# Patient Record
Sex: Female | Born: 1985 | Race: Black or African American | Hispanic: No | Marital: Married | Smoking: Never smoker
Health system: Southern US, Community
[De-identification: ages and names within clinical notes are randomized; demographics above are authoritative.]

## PROBLEM LIST (undated history)

## (undated) DIAGNOSIS — G4730 Sleep apnea, unspecified: Secondary | ICD-10-CM

---

## 2021-02-27 DIAGNOSIS — D649 Anemia, unspecified: Secondary | ICD-10-CM

## 2021-02-27 HISTORY — DX: Anemia, unspecified: D64.9

## 2021-03-12 ENCOUNTER — Other Ambulatory Visit (HOSPITAL_COMMUNITY): Payer: Self-pay | Admitting: General Surgery

## 2021-03-12 ENCOUNTER — Other Ambulatory Visit: Payer: Self-pay | Admitting: General Surgery

## 2021-03-12 DIAGNOSIS — M5441 Lumbago with sciatica, right side: Secondary | ICD-10-CM

## 2021-03-12 DIAGNOSIS — G8929 Other chronic pain: Secondary | ICD-10-CM

## 2021-03-12 DIAGNOSIS — M5442 Lumbago with sciatica, left side: Secondary | ICD-10-CM

## 2021-03-25 ENCOUNTER — Other Ambulatory Visit: Payer: Self-pay

## 2021-03-25 ENCOUNTER — Ambulatory Visit (HOSPITAL_COMMUNITY)
Admission: RE | Admit: 2021-03-25 | Discharge: 2021-03-25 | Disposition: A | Discharge status: Home / Self Care | Payer: 59 | Source: Ambulatory Visit | Attending: General Surgery | Admitting: General Surgery

## 2021-03-25 DIAGNOSIS — G8929 Other chronic pain: Secondary | ICD-10-CM | POA: Diagnosis present

## 2021-03-25 DIAGNOSIS — M5442 Lumbago with sciatica, left side: Secondary | ICD-10-CM | POA: Diagnosis present

## 2021-03-25 DIAGNOSIS — M5441 Lumbago with sciatica, right side: Secondary | ICD-10-CM | POA: Diagnosis present

## 2021-03-27 ENCOUNTER — Ambulatory Visit (HOSPITAL_COMMUNITY)
Admission: RE | Admit: 2021-03-27 | Discharge: 2021-03-27 | Disposition: A | Discharge status: Home / Self Care | Payer: 59 | Source: Ambulatory Visit | Attending: General Surgery | Admitting: General Surgery

## 2021-03-27 ENCOUNTER — Other Ambulatory Visit: Payer: Self-pay

## 2021-03-27 DIAGNOSIS — M5441 Lumbago with sciatica, right side: Secondary | ICD-10-CM | POA: Insufficient documentation

## 2021-03-27 DIAGNOSIS — G8929 Other chronic pain: Secondary | ICD-10-CM | POA: Diagnosis present

## 2021-03-27 DIAGNOSIS — M5442 Lumbago with sciatica, left side: Secondary | ICD-10-CM | POA: Insufficient documentation

## 2021-04-10 ENCOUNTER — Encounter: Payer: 59 | Attending: General Surgery | Admitting: Skilled Nursing Facility1

## 2021-04-10 ENCOUNTER — Other Ambulatory Visit: Payer: Self-pay

## 2021-04-10 ENCOUNTER — Encounter: Payer: Self-pay | Admitting: Skilled Nursing Facility1

## 2021-04-10 DIAGNOSIS — E669 Obesity, unspecified: Secondary | ICD-10-CM | POA: Insufficient documentation
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
## 2021-04-18 ENCOUNTER — Other Ambulatory Visit: Payer: Self-pay

## 2021-04-18 ENCOUNTER — Encounter: Payer: 59 | Admitting: Skilled Nursing Facility1

## 2021-04-18 DIAGNOSIS — E669 Obesity, unspecified: Secondary | ICD-10-CM | POA: Diagnosis not present

## 2021-04-18 NOTE — Progress Notes (Signed)
Supervised Weight Loss Visit ?Bariatric Nutrition Education ? ?Sleeve Gastrectomy  ? ?SWL visits 1 of 3 ? ?NUTRITION ASSESSMENT ? ?Anthropometrics  ?Start weight at [facility]: 266.3 lbs (date: 04/10/2021)  ?Weight: 267.2 pounds ?BMI: 44.46 kg/m2   ?  ?Clinical  ?Medical hx: N/A ?Medications: multivitamin, iron  ?Labs: RBC 3.13, HGB 9.4, HCT 28.9, A1C 5.7 ?Notable signs/symptoms: none reported ?Any previous deficiencies? YES, anemia ? ?Lifestyle & Dietary Hx ? ?Pt states she has a carpal tunnel surgery tomorrow.  ?Pt states her friend from out of town is going to help her and husband out with the kids. ?Pt states her energy level has been so so and has been taking her iron pills daily. Pt states she is going to start eating beets.  ?Pt states she just got a sleep study done. ?Pt states she has been suffering form seasonal allergies lately.  Pt states seh has not been eating 3 meals a day because her appetite has been reduced since being so tired. Pt states her husband and friend will meal prep for her. Pt states she has bought some plant based proteins to try and wants to have some more variety.  ?Pt states her husband is very supportive and has been thinking ideas of how to support her even more.  ?Pt states she has slowed down with her meals and counts in her head.  ? ?Estimated daily fluid intake:  oz ?Supplements:  ?Current average weekly physical activity: walking her dog, some bike riding  ? ?24-Hr Dietary Recall ?First Meal: cream of wheat + egg or skipped or honey citrus tea from [employer]bucks ?Snack:  ?Second Meal: tortilla chicken soup or smoothie with greens, fruits, water, ice, protein powder  ?Snack:  ?Third Meal: tortilla chicken soup ?Snack:  ?Beverages: honey citrus tea, water ? ?Estimated Energy Needs ?Calories: 1500 ? ? ?NUTRITION DIAGNOSIS  ?Overweight/obesity ([place]-3.3) related to past poor dietary habits and physical inactivity as evidenced by patient w/ planned sleeve gastrectomy surgery following dietary  guidelines for continued weight loss. ? ? ?NUTRITION INTERVENTION  ?Nutrition counseling (C-1) and education (E-2) to facilitate bariatric surgery goals. ? ?Pre-Op Goals Progress & New Goals ?continue: Consume 3 meals per day or try to eat every 3-5 hours: eat breakfast ?continue: Practice CHEWING your food (aim for applesauce consistency): slow down when eating ?NEW: make a smoothie for breakfast ?NEW: increase activity to 6 days a week ?Continue: reducing diet mentality and feeling proud of the food choices you make ? ?Handouts Provided Include  ? ? ?Learning Style & Readiness for Change ?Teaching method utilized: Visual & Auditory  ?Demonstrated degree of understanding via: Teach Back  ?Readiness Level: action ?Barriers to learning/adherence to lifestyle change: emotional eating ? ?RD's Notes for next Visit  ?Assess pts adherence to chosen goals ? ? ?MONITORING & EVALUATION ?Dietary intake, weekly physical activity, body weight, and pre-op goals in 1 month.  ? ?Next Steps  ?Patient is to return to [facility] in 1 month ?

## 2021-05-15 ENCOUNTER — Encounter: Payer: 59 | Attending: General Surgery | Admitting: Skilled Nursing Facility1

## 2021-05-15 DIAGNOSIS — Z6841 Body Mass Index (BMI) 40.0 and over, adult: Secondary | ICD-10-CM | POA: Insufficient documentation

## 2021-05-15 DIAGNOSIS — E669 Obesity, unspecified: Secondary | ICD-10-CM | POA: Insufficient documentation

## 2021-05-15 DIAGNOSIS — Z713 Dietary counseling and surveillance: Secondary | ICD-10-CM | POA: Insufficient documentation

## 2021-05-15 DIAGNOSIS — M5442 Lumbago with sciatica, left side: Secondary | ICD-10-CM | POA: Diagnosis not present

## 2021-05-15 DIAGNOSIS — G8929 Other chronic pain: Secondary | ICD-10-CM | POA: Insufficient documentation

## 2021-05-15 DIAGNOSIS — M5441 Lumbago with sciatica, right side: Secondary | ICD-10-CM | POA: Insufficient documentation

## 2021-05-15 DIAGNOSIS — Z9189 Other specified personal risk factors, not elsewhere classified: Secondary | ICD-10-CM | POA: Diagnosis not present

## 2021-05-15 NOTE — Progress Notes (Addendum)
Supervised Weight Loss Visit ?Bariatric Nutrition Education ? ?Sleeve Gastrectomy  ? ?SWL visits 2 of 3 ? ?NUTRITION ASSESSMENT ? ?Anthropometrics  ?Start weight at [facility]: 266.3 lbs (date: 04/10/2021)  ?Weight:  269 pounds ?BMI: 44.76 kg/m2   ?  ?Clinical  ?Medical hx: N/A ?Medications: multivitamin, iron 320mg , progesterone  ?Labs: RBC 3.13, HGB 9.4, HCT 28.9, A1C 5.7 ?Notable signs/symptoms: none reported ?Any previous deficiencies? YES, anemia ? ?Lifestyle & Dietary Hx ? ?Pt states due to her consistently heavy menstrual cycles leading to iron deficiency anemia she did get a dilation and curettage which has for now reduced the issue.  ?Pt states she is doing better now and her energy level is much better as well. ?Pt states she works from home ?Pt states she was advised by a family member to get a healthy breakfast ideas book and thinks maybe she will get that.  ?Pt states she has had no fast food and is very proud of this.  ?Pt states due to her low energy level and was busy getting her kids to school she was not able to work on some of the goals this past month. Pt states she is not used to eating breakfast so is struggling with the idea she needs to eat it.  ? ?Pt states making a Smoothie went well for 3 days but then get out of doing it due to taking her kids to school and being tired.  ? ?Pt states she has been tired and trying to go to bed early. ? ?Estimated daily fluid intake:  oz ?Supplements:  ?Current average weekly physical activity: walking her dog, some bike riding  ? ?24-Hr Dietary Recall ?First Meal: cream of wheat + egg or skipped or honey citrus tea from [employer] ?coffee ?Snack:  ?Second Meal: tortilla chicken soup or smoothie with greens, fruits, water, ice, protein powder  ?Snack:  ?Third Meal: tortilla chicken soup or spaghetti (family), chicken, peas ?Snack:  ?Beverages: honey citrus tea, current: water, coffee ? ?Estimated Energy Needs ?Calories: 1500 ? ? ?NUTRITION DIAGNOSIS   ?Overweight/obesity ([place]-3.3) related to past poor dietary habits and physical inactivity as evidenced by patient w/ planned sleeve gastrectomy surgery following dietary guidelines for continued weight loss. ? ? ?NUTRITION INTERVENTION  ?Nutrition counseling (C-1) and education (E-2) to facilitate bariatric surgery goals. ? ? ? ?Pre-Op Goals Progress & New Goals ?continue: Practice CHEWING your food (aim for applesauce consistency): slow down when eating ?Continue: reducing diet mentality and feeling proud of the food choices you make ?Re-engage: Consume 3 meals per day or try to eat every 3-5 hours ?Re-engage: eat something for breakfast or a protein shake ?Re-engage: increase activity to 6 days a week ? ?Handouts Provided Include  ? ? ?Learning Style & Readiness for Change ?Teaching method utilized: Visual & Auditory  ?Demonstrated degree of understanding via: Teach Back  ?Readiness Level: action ?Barriers to learning/adherence to lifestyle change: emotional eating ? ?RD's Notes for next Visit  ?Assess pts adherence to chosen goals ? ? ?MONITORING & EVALUATION ?Dietary intake, weekly physical activity, body weight, and pre-op goals in 1 month.  ? ?Next Steps  ?Patient is to return to [facility] in 1 month ?

## 2021-06-18 ENCOUNTER — Encounter: Payer: Self-pay | Admitting: Skilled Nursing Facility1

## 2021-06-18 ENCOUNTER — Encounter: Payer: 59 | Attending: General Surgery | Admitting: Skilled Nursing Facility1

## 2021-06-18 DIAGNOSIS — E669 Obesity, unspecified: Secondary | ICD-10-CM | POA: Insufficient documentation

## 2021-06-18 NOTE — Progress Notes (Unsigned)
Supervised Weight Loss Visit Bariatric Nutrition Education  Sleeve Gastrectomy   SWL visits 3 of 3  Pt completed visits.   Pt has cleared nutrition requirements.   Pt has completed visits. No further supervised visits required/recomended    NUTRITION ASSESSMENT  Anthropometrics  Start weight at [facility]: 266.3 lbs (date: 04/10/2021)  Weight:  272.8 pounds BMI: 45.4 kg/m2     Clinical  Medical hx: N/A Medications: multivitamin, iron 320mg , progesterone  Labs: RBC 3.13, HGB 9.4, HCT 28.9, A1C 5.7 Notable signs/symptoms: none reported Any previous deficiencies? YES, anemia  Lifestyle & Dietary Hx  Pt states she had left hand carpel tunnel surgery recently. Pt states she needs to set an alarm to remind her to eat breakfast. Pt states the weekend is easier to get in all the meals Pt states she does not want to get sick after surgery. Pt states she is trying to get nutrition straight and the mental health for therapy. Pt states her dinner preps have gone well. Pt states she includes her son in meal prep. Mindful of what she is cooking and preparing for he and the family. And keeping foods natural Talking to her therapist: When the wt starts to fall off, she does not want to get too fixated on the wt, stating she has a love hate relationship with food. Pt is recognizes the need for mindful decisions when making food choices. Pt states she is contemplating going vegetarian or pescatarian.     Estimated daily fluid intake:  oz Supplements:  Current average weekly physical activity: walking her dog, some bike riding   24-Hr Dietary Recall First Meal: skip or protein shake or  Snack:  Second Meal: tortilla chicken soup or smoothie with greens, fruits, water, ice, protein powder  Snack:  Third Meal: tortilla chicken soup or spaghetti (family), chicken, peas Snack:  Beverages: honey citrus tea, current: water, coffee   Estimated Energy Needs Calories: 1500   NUTRITION  DIAGNOSIS  Overweight/obesity ([place]-3.3) related to past poor dietary habits and physical inactivity as evidenced by patient w/ planned sleeve gastrectomy surgery following dietary guidelines for continued weight loss.   NUTRITION INTERVENTION  Nutrition counseling (C-1) and education (E-2) to facilitate bariatric surgery goals.    Pre-Op Goals Progress & New Goals continue: Practice CHEWING your food (aim for applesauce consistency): slow down when eating Continue: reducing diet mentality and feeling proud of the food choices you make Continue: Consume 3 meals per day or try to eat every 3-5 hours Continue: eat something for breakfast or a protein shake; set an alarm Continue: increase activity to 6 days a week  Handouts Provided Include    Learning Style & Readiness for Change Teaching method utilized: Visual & Auditory  Demonstrated degree of understanding via: Teach Back  Readiness Level: action Barriers to learning/adherence to lifestyle change: emotional eating  RD's Notes for next Visit  Assess pts adherence to chosen goals   MONITORING & EVALUATION Dietary intake, weekly physical activity, body weight, and pre-op goals in 1 month.   Next Steps  Pt has completed visits. No further supervised visits required/recomended  Patient is to return to [facility] for pre-op class; date TBD

## 2021-06-19 ENCOUNTER — Encounter: Payer: Self-pay | Admitting: Skilled Nursing Facility1

## 2021-07-01 ENCOUNTER — Encounter: Payer: 59 | Attending: General Surgery | Admitting: Skilled Nursing Facility1

## 2021-07-01 ENCOUNTER — Encounter: Payer: Self-pay | Admitting: Skilled Nursing Facility1

## 2021-07-01 DIAGNOSIS — E669 Obesity, unspecified: Secondary | ICD-10-CM | POA: Diagnosis present

## 2021-07-01 NOTE — Progress Notes (Signed)
Pre-Operative Nutrition Class:    Patient was seen on 07/01/2021 for Pre-Operative [facility]ric [facility]    Surgery date: 07/29/2021 Surgery type: sleeve Start weight at [facility]: 266.3 Weight today: 271.7 pounds  Samples given per MNT protocol. Patient educated on appropriate usage: Bariatric Advantage Multivitamin Lot # [id number] Exp:08/24   Bariatric Advantage Calcium  Lot #[id number] Exp: 12/02/2021   Protein Shake Lot # [id number] Exp: [id number]   The following the learning objectives were met by the patient during this course: Identify Pre-Op Dietary Goals and will begin 2 weeks pre-operatively Identify appropriate sources of fluids and proteins  State protein recommendations and appropriate sources pre and post-operatively Identify Post-Operative Dietary Goals and will follow for 2 weeks post-operatively Identify appropriate multivitamin and calcium sources Describe the need for physical activity post-operatively and will follow MD recommendations State when to call healthcare provider regarding medication questions or post-operative complications When having a diagnosis of diabetes understanding hypoglycemia symptoms and the inclusion of 1 complex carbohydrate per meal  Handouts given during class include: Pre-Op Bariatric Surgery Diet Handout Protein Shake Handout Post-Op Bariatric Surgery Nutrition Handout [facility] Program Information Flyer Support Group Information Flyer [facility] Bariatric Supplements Price List  Follow-Up Plan: Patient will follow-up at [facility] 2 weeks post operatively for diet advancement per MD.

## 2021-07-11 ENCOUNTER — Ambulatory Visit: Payer: Self-pay | Admitting: General Surgery

## 2021-07-11 DIAGNOSIS — D649 Anemia, unspecified: Secondary | ICD-10-CM

## 2021-07-12 ENCOUNTER — Other Ambulatory Visit (HOSPITAL_COMMUNITY): Payer: Self-pay

## 2021-07-12 NOTE — Patient Instructions (Addendum)
DUE TO COVID-19 ONLY TWO VISITORS  (aged 36 and older)  ARE ALLOWED TO COME WITH YOU AND STAY IN THE WAITING ROOM ONLY DURING PRE OP AND PROCEDURE.   **NO VISITORS ARE ALLOWED IN THE SHORT STAY AREA OR RECOVERY ROOM!!**  IF YOU WILL BE ADMITTED INTO THE HOSPITAL YOU ARE ALLOWED ONLY FOUR SUPPORT PEOPLE DURING VISITATION HOURS ONLY (7 AM -8PM)   The support person(s) must pass our screening, gel in and out, and wear a mask at all times, including in the patient's room. Patients must also wear a mask when staff or their support person are in the room. Visitors GUEST BADGE MUST BE WORN VISIBLY  One adult visitor may remain with you overnight and MUST be in the room by 8 P.M.     Your procedure is scheduled on: 07/29/21   Report to [facility] Main Entrance    Report to admitting at  11:00 AM   Call this number if you have problems the morning of surgery [phone]   Do not eat food :After  6:00 PM the night before surgery   start a clear liquid diet until 10:00 AM the day of surgery   Water Black Coffee (sugar ok, NO MILK/CREAM OR CREAMERS)  Tea (sugar ok, NO MILK/CREAM OR CREAMERS) regular and decaf                             Plain Jell-O (NO RED)                                           Fruit ices (not with fruit pulp, NO RED)                                     Popsicles (NO RED)                                                                  Juice: apple, WHITE grape, WHITE cranberry Sports drinks like Gatorade (NO RED) Clear broth(vegetable,chicken,beef)                    The day of surgery:  Drink ONE (1) Pre-Surgery Clear Ensure or G2  the morning of surgery. Drink in one sitting. Do not sip.  This drink was given to you during your hospital  pre-op appointment visit. Nothing else to drink after completing the   G2. At 10:00 am          If you have questions, please contact your surgeon's office.     THE G2 DRINK YOU DRINK BEFORE YOU LEAVE HOME WILL  BE THE LAST FLUIDS YOU DRINK BEFORE SURGERY THEN NOTHING MORE BY MOUTH.   PAIN IS EXPECTED AFTER SURGERY AND WILL NOT BE COMPLETELY ELIMINATED.   AMBULATION AND TYLENOL WILL HELP REDUCE INCISIONAL AND GAS PAIN. MOVEMENT IS KEY!  YOU ARE EXPECTED TO BE OUT OF BED WITHIN 4 HOURS OF ADMISSION TO YOUR PATIENT ROOM.  SITTING IN THE RECLINER THROUGHOUT THE DAY IS IMPORTANT FOR  DRINKING FLUIDS AND MOVING GAS THROUGHOUT THE GI TRACT.  COMPRESSION STOCKINGS SHOULD BE WORN [facility] STAY UNLESS YOU ARE WALKING.   INCENTIVE SPIROMETER SHOULD BE USED EVERY HOUR WHILE AWAKE TO DECREASE POST-OPERATIVE COMPLICATIONS SUCH AS PNEUMONIA.  WHEN DISCHARGED HOME, IT IS IMPORTANT TO CONTINUE TO WALK EVERY HOUR AND USE THE INCENTIVE SPIROMETER EVERY HOUR.    Bring mask and tubing       FOLLOW BOWEL PREP AND ANY ADDITIONAL PRE OP INSTRUCTIONS YOU RECEIVED FROM YOUR SURGEON'S OFFICE!!!     Oral Hygiene is also important to reduce your risk of infection.                                    Remember - BRUSH YOUR TEETH THE MORNING OF SURGERY WITH YOUR REGULAR TOOTHPASTE   Do NOT smoke after Midnight   Take these medicines the morning of surgery with A SIP OF WATER: none                                You may not have any metal on your body including hair pins, jewelry, and body piercing             Do not wear make-up, lotions, powders, perfumes/cologne, or deodorant  Do not wear nail polish including gel and S&S, artificial/acrylic nails, or any other type of covering on natural nails including finger and toenails. If you have artificial nails, gel coating, etc. that needs to be removed by a nail salon please have this removed prior to surgery or surgery may need to be canceled/ delayed if the surgeon/ anesthesia feels like they are unable to be safely monitored.   Do not shave  48 hours prior to surgery.     Do not bring valuables to the hospital. [facility] IS NOT              RESPONSIBLE   FOR VALUABLES.   Contacts, dentures or bridgework may not be worn into surgery.   Bring small overnight bag day of surgery.   DO NOT BRING YOUR HOME MEDICATIONS TO THE HOSPITAL. PHARMACY WILL DISPENSE MEDICATIONS LISTED ON YOUR MEDICATION LIST TO YOU DURING YOUR ADMISSION IN THE HOSPITAL!     Special Instructions: Bring a copy of your healthcare power of attorney and living will documents  the day of surgery if you haven't scanned them before.              Please read over the following fact sheets you were given: IF YOU HAVE QUESTIONS ABOUT YOUR PRE-OP INSTRUCTIONS PLEASE CALL [phone]     [facility] - Preparing for Surgery Before surgery, you can play an important role.  Because skin is not sterile, your skin needs to be as free of germs as possible.  You can reduce the number of germs on your skin by washing with CHG (chlorahexidine gluconate) soap before surgery.  CHG is an antiseptic cleaner which kills germs and bonds with the skin to continue killing germs even after washing. Please DO NOT use if you have an allergy to CHG or antibacterial soaps.  If your skin becomes reddened/irritated stop using the CHG and inform your nurse when you arrive at Short Stay. Do not shave (including legs and underarms) for at least 48 hours prior to the first CHG shower.   Please  follow these instructions carefully:  1.  Shower with CHG Soap the night before surgery and the  morning of Surgery.  2.  If you choose to wash your hair, wash your hair first as usual with your  normal  shampoo.  3.  After you shampoo, rinse your hair and body thoroughly to remove the  shampoo.                            4.  Use CHG as you would any other liquid soap.  You can apply chg directly  to the skin and wash                       Gently with a scrungie or clean washcloth.  5.  Apply the CHG Soap to your body ONLY FROM THE NECK DOWN.   Do not use on face/ open                           Wound or open  sores. Avoid contact with eyes, ears mouth and genitals (private parts).                       Wash face,  Genitals (private parts) with your normal soap.             6.  Wash thoroughly, paying special attention to the area where your surgery  will be performed.  7.  Thoroughly rinse your body with warm water from the neck down.  8.  DO NOT shower/wash with your normal soap after using and rinsing off  the CHG Soap.                9.  Pat yourself dry with a clean towel.            10.  Wear clean pajamas.            11.  Place clean sheets on your bed the night of your first shower and do not  sleep with pets. Day of Surgery : Do not apply any lotions/deodorants the morning of surgery.  Please wear clean clothes to the hospital/surgery center.  FAILURE TO FOLLOW THESE INSTRUCTIONS MAY RESULT IN THE CANCELLATION OF YOUR SURGERY    ________________________________________________________________________   Incentive Spirometer  An incentive spirometer is a tool that can help keep your lungs clear and active. This tool measures how well you are filling your lungs with each breath. Taking long deep breaths may help reverse or decrease the chance of developing breathing (pulmonary) problems (especially infection) following: A long period of time when you are unable to move or be active. BEFORE THE PROCEDURE  If the spirometer includes an indicator to show your best effort, your nurse or respiratory therapist will set it to a desired goal. If possible, sit up straight or lean slightly forward. Try not to slouch. Hold the incentive spirometer in an upright position. INSTRUCTIONS FOR USE  Sit on the edge of your bed if possible, or sit up as far as you can in bed or on a chair. Hold the incentive spirometer in an upright position. Breathe out normally. Place the mouthpiece in your mouth and seal your lips tightly around it. Breathe in slowly and as deeply as possible, raising the piston or the  ball toward the top of the column. Hold your breath for 3-5  seconds or for as long as possible. Allow the piston or ball to fall to the bottom of the column. Remove the mouthpiece from your mouth and breathe out normally. Rest for a few seconds and repeat Steps 1 through 7 at least 10 times every 1-2 hours when you are awake. Take your time and take a few normal breaths between deep breaths. The spirometer may include an indicator to show your best effort. Use the indicator as a goal to work toward during each repetition. After each set of 10 deep breaths, practice coughing to be sure your lungs are clear. If you have an incision (the cut made at the time of surgery), support your incision when coughing by placing a pillow or rolled up towels firmly against it. Once you are able to get out of bed, walk around indoors and cough well. You may stop using the incentive spirometer when instructed by your caregiver.  RISKS AND COMPLICATIONS Take your time so you do not get dizzy or light-headed. If you are in pain, you may need to take or ask for pain medication before doing incentive spirometry. It is harder to take a deep breath if you are having pain. AFTER USE Rest and breathe slowly and easily. It can be helpful to keep track of a log of your progress. Your caregiver can provide you with a simple table to help with this. If you are using the spirometer at home, follow these instructions: SEEK MEDICAL CARE IF:  You are having difficultly using the spirometer. You have trouble using the spirometer as often as instructed. Your pain medication is not giving enough relief while using the spirometer. You develop fever of 100.5 F (38.1 C) or higher. SEEK IMMEDIATE MEDICAL CARE IF:  You cough up bloody sputum that had not been present before. You develop fever of 102 F (38.9 C) or greater. You develop worsening pain at or near the incision site. MAKE SURE YOU:  Understand these instructions. Will  watch your condition. Will get help right away if you are not doing well or get worse. Document Released: 05/26/2006 Document Revised: 04/07/2011 Document Reviewed: 07/27/2006 [facility] Patient Information 2014 [place], [place].   ________________________________________________________________________

## 2021-07-18 ENCOUNTER — Encounter (HOSPITAL_COMMUNITY): Payer: Self-pay | Admitting: *Deleted

## 2021-07-18 ENCOUNTER — Other Ambulatory Visit: Payer: Self-pay

## 2021-07-18 ENCOUNTER — Encounter (HOSPITAL_COMMUNITY)
Admission: RE | Admit: 2021-07-18 | Discharge: 2021-07-18 | Disposition: A | Discharge status: Home / Self Care | Payer: 59 | Source: Ambulatory Visit | Attending: General Surgery | Admitting: General Surgery

## 2021-07-18 DIAGNOSIS — Z01812 Encounter for preprocedural laboratory examination: Secondary | ICD-10-CM | POA: Insufficient documentation

## 2021-07-18 DIAGNOSIS — D649 Anemia, unspecified: Secondary | ICD-10-CM | POA: Diagnosis not present

## 2021-07-18 HISTORY — DX: Sleep apnea, unspecified: G47.30

## 2021-07-18 LAB — COMPREHENSIVE METABOLIC PANEL
ALT: 44 U/L (ref 0–44)
AST: 31 U/L (ref 15–41)
Albumin: 4.4 g/dL (ref 3.5–5.0)
Alkaline Phosphatase: 53 U/L (ref 38–126)
Anion gap: 8 (ref 5–15)
BUN: 9 mg/dL (ref 6–20)
CO2: 26 mmol/L (ref 22–32)
Calcium: 9.6 mg/dL (ref 8.9–10.3)
Chloride: 104 mmol/L (ref 98–111)
Creatinine, Ser: 0.7 mg/dL (ref 0.44–1.00)
GFR, Estimated: >60 mL/min (ref >60)
Glucose, Bld: 83 mg/dL (ref 70–99)
Potassium: 4.1 mmol/L (ref 3.5–5.1)
Sodium: 138 mmol/L (ref 135–145)
Total Bilirubin: 0.4 mg/dL (ref 0.3–1.2)
Total Protein: 8.3 g/dL — ABNORMAL HIGH (ref 6.5–8.1)

## 2021-07-18 LAB — CBC WITH DIFFERENTIAL/PLATELET
Abs Immature Granulocytes: 0 10*3/uL (ref 0.00–0.07)
Basophils Absolute: 0.1 10*3/uL (ref 0.0–0.1)
Basophils Relative: 1 %
Eosinophils Absolute: 0.2 10*3/uL (ref 0.0–0.5)
Eosinophils Relative: 4 %
HCT: 40.3 % (ref 36.0–46.0)
Hemoglobin: 13.2 g/dL (ref 12.0–15.0)
Immature Granulocytes: 0 %
Lymphocytes Relative: 33 %
Lymphs Abs: 1.6 10*3/uL (ref 0.7–4.0)
MCH: 32.1 pg (ref 26.0–34.0)
MCHC: 32.8 g/dL (ref 30.0–36.0)
MCV: 98.1 fL (ref 80.0–100.0)
Monocytes Absolute: 0.3 10*3/uL (ref 0.1–1.0)
Monocytes Relative: 7 %
Neutro Abs: 2.6 10*3/uL (ref 1.7–7.7)
Neutrophils Relative %: 55 %
Platelets: 326 10*3/uL (ref 150–400)
RBC: 4.11 MIL/uL (ref 3.87–5.11)
RDW: 13.8 % (ref 11.5–15.5)
WBC: 4.8 10*3/uL (ref 4.0–10.5)
nRBC: 0 % (ref 0.0–0.2)

## 2021-07-18 LAB — PROTIME-INR
INR: 1 (ref 0.8–1.2)
Prothrombin Time: 13.3 seconds (ref 11.4–15.2)

## 2021-07-18 LAB — TYPE AND SCREEN
ABO/RH(D): O POS
Antibody Screen: NEGATIVE

## 2021-07-18 LAB — APTT: aPTT: 31 seconds (ref 24–36)

## 2021-07-18 NOTE — Progress Notes (Signed)
Anesthesia note:  Bowel prep reminder:NA  PCP - Kathryne Sharper V.A. Cardiologist -no Other-   Chest x-ray - 03/26/21-epic EKG - 03/25/21-epic Stress Test - no ECHO - no Cardiac Cath - NA  Pacemaker/ICD device last checked:NA  Sleep Study - yes CPAP - yes  Pt is pre diabetic-NA Fasting Blood Sugar -  Checks Blood Sugar _____  Blood Thinner:NA Blood Thinner Instructions: Aspirin Instructions: Last Dose:  Anesthesia review: no  Patient denies shortness of breath, fever, cough and chest pain at PAT appointment Pt hag SOB when she was anemic but it is much better now  Patient verbalized understanding of instructions that were given to them at the PAT appointment. Patient was also instructed that they will need to review over the PAT instructions again at home before surgery. yes

## 2021-07-26 ENCOUNTER — Telehealth (HOSPITAL_COMMUNITY): Payer: Self-pay | Admitting: *Deleted

## 2021-07-26 NOTE — Anesthesia Preprocedure Evaluation (Signed)
Anesthesia Evaluation  Patient identified by MRN, date of birth, ID band Patient awake    Reviewed: Allergy & Precautions, NPO status , Patient's Chart, lab work & pertinent test results  Airway Mallampati: II  TM Distance: >3 FB Neck ROM: Full    Dental no notable dental hx. (+) Teeth Intact, Dental Advisory Given   Pulmonary sleep apnea and Continuous Positive Airway Pressure Ventilation ,    Pulmonary exam normal breath sounds clear to auscultation       Cardiovascular negative cardio ROS Normal cardiovascular exam Rhythm:Regular Rate:Normal     Neuro/Psych negative neurological ROS  negative psych ROS   GI/Hepatic negative GI ROS, (+)       alcohol use,   Endo/Other  Morbid obesityBMI 45  Renal/GU negative Renal ROS  negative genitourinary   Musculoskeletal negative musculoskeletal ROS (+)   Abdominal (+) + obese,   Peds  Hematology negative hematology ROS (+) Hb 13.2   Anesthesia Other Findings   Reproductive/Obstetrics negative OB ROS                            Anesthesia Physical Anesthesia Plan  ASA: 3  Anesthesia Plan: General   Post-op Pain Management: Tylenol PO (pre-op)*   Induction: Intravenous  PONV Risk Score and Plan: 4 or greater and Ondansetron, Dexamethasone, Midazolam, Scopolamine patch - Pre-op and Treatment may vary due to age or medical condition  Airway Management Planned: Oral ETT  Additional Equipment: None  Intra-op Plan:   Post-operative Plan: Extubation in OR  Informed Consent: I have reviewed the patients History and Physical, chart, labs and discussed the procedure including the risks, benefits and alternatives for the proposed anesthesia with the patient or authorized representative who has indicated his/her understanding and acceptance.     Dental advisory given  Plan Discussed with: CRNA  Anesthesia Plan Comments:         Anesthesia Quick Evaluation

## 2021-07-26 NOTE — Telephone Encounter (Signed)
Reviewed Gastric Sleeve discharge instructions with via phone.  Provided information on signs and symptoms to report, Phase 2 post op liquid diet, BELT program, Support Group and [facility] outpatient pharmacy. Patient can articulate understanding. All questions answered.  Patient provided with surgeon, dietician, and [facility] contact information for any comments, questions, or concerns via email and will receive discharge folder during hospitalization. Will f/u with patient within one week post operatively. 

## 2021-07-29 ENCOUNTER — Other Ambulatory Visit: Payer: Self-pay

## 2021-07-29 ENCOUNTER — Encounter (HOSPITAL_COMMUNITY)
Admission: RE | Disposition: A | Discharge status: Home / Self Care | Payer: Self-pay | Source: Home / Self Care | Attending: General Surgery

## 2021-07-29 ENCOUNTER — Inpatient Hospital Stay (HOSPITAL_COMMUNITY)
Admission: RE | Admit: 2021-07-29 | Discharge: 2021-07-31 | DRG: 621 | Disposition: A | Discharge status: Home / Self Care | Payer: 59 | Attending: General Surgery | Admitting: General Surgery

## 2021-07-29 ENCOUNTER — Other Ambulatory Visit (HOSPITAL_COMMUNITY): Payer: Self-pay

## 2021-07-29 ENCOUNTER — Encounter (HOSPITAL_COMMUNITY): Payer: Self-pay | Admitting: General Surgery

## 2021-07-29 ENCOUNTER — Inpatient Hospital Stay (HOSPITAL_COMMUNITY): Payer: 59 | Admitting: Anesthesiology

## 2021-07-29 DIAGNOSIS — Z9884 Bariatric surgery status: Principal | ICD-10-CM

## 2021-07-29 DIAGNOSIS — F419 Anxiety disorder, unspecified: Secondary | ICD-10-CM | POA: Diagnosis present

## 2021-07-29 DIAGNOSIS — Z6841 Body Mass Index (BMI) 40.0 and over, adult: Secondary | ICD-10-CM

## 2021-07-29 DIAGNOSIS — K760 Fatty (change of) liver, not elsewhere classified: Secondary | ICD-10-CM | POA: Diagnosis present

## 2021-07-29 DIAGNOSIS — Z83438 Family history of other disorder of lipoprotein metabolism and other lipidemia: Secondary | ICD-10-CM | POA: Diagnosis not present

## 2021-07-29 DIAGNOSIS — G8929 Other chronic pain: Secondary | ICD-10-CM | POA: Diagnosis present

## 2021-07-29 DIAGNOSIS — R7303 Prediabetes: Secondary | ICD-10-CM | POA: Diagnosis present

## 2021-07-29 DIAGNOSIS — G4733 Obstructive sleep apnea (adult) (pediatric): Secondary | ICD-10-CM | POA: Diagnosis present

## 2021-07-29 DIAGNOSIS — M5442 Lumbago with sciatica, left side: Secondary | ICD-10-CM | POA: Diagnosis present

## 2021-07-29 DIAGNOSIS — E781 Pure hyperglyceridemia: Secondary | ICD-10-CM | POA: Diagnosis present

## 2021-07-29 DIAGNOSIS — D649 Anemia, unspecified: Secondary | ICD-10-CM

## 2021-07-29 DIAGNOSIS — M5441 Lumbago with sciatica, right side: Secondary | ICD-10-CM | POA: Diagnosis present

## 2021-07-29 DIAGNOSIS — M5136 Other intervertebral disc degeneration, lumbar region: Secondary | ICD-10-CM | POA: Diagnosis present

## 2021-07-29 DIAGNOSIS — E7800 Pure hypercholesterolemia, unspecified: Secondary | ICD-10-CM | POA: Diagnosis present

## 2021-07-29 HISTORY — PX: UPPER GI ENDOSCOPY: SHX6162

## 2021-07-29 LAB — HEMOGLOBIN AND HEMATOCRIT, BLOOD
HCT: 37.1 % (ref 36.0–46.0)
Hemoglobin: 12.2 g/dL (ref 12.0–15.0)

## 2021-07-29 LAB — ABO/RH: ABO/RH(D): O POS

## 2021-07-29 SURGERY — GASTRECTOMY, SLEEVE, ROBOT-ASSISTED
Anesthesia: General

## 2021-07-29 MED ORDER — DEXTROSE 5 % IV SOLN
440.0000 mg | INTRAVENOUS | Status: AC
  Administered 2021-07-29: 440 mg via INTRAVENOUS
  Filled 2021-07-29: qty 11

## 2021-07-29 MED ORDER — BUPIVACAINE LIPOSOME 1.3 % IJ SUSP: 20.0000 mL | Freq: Once | INTRAMUSCULAR | Status: DC

## 2021-07-29 MED ORDER — SIMETHICONE 80 MG PO CHEW
80.0000 mg | CHEWABLE_TABLET | Freq: Four times a day (QID) | ORAL | Status: DC | PRN
  Administered 2021-07-29 – 2021-07-31 (×3): 80 mg via ORAL
  Filled 2021-07-29 (×3): qty 1

## 2021-07-29 MED ORDER — APREPITANT 40 MG PO CAPS
40.0000 mg | ORAL_CAPSULE | ORAL | Status: AC
  Administered 2021-07-29: 40 mg via ORAL
  Filled 2021-07-29: qty 1

## 2021-07-29 MED ORDER — DEXAMETHASONE SODIUM PHOSPHATE 10 MG/ML IJ SOLN
INTRAMUSCULAR | Status: AC
  Filled 2021-07-29: qty 1

## 2021-07-29 MED ORDER — ONDANSETRON HCL 4 MG/2ML IJ SOLN
INTRAMUSCULAR | Status: DC | PRN
  Administered 2021-07-29: 4 mg via INTRAVENOUS

## 2021-07-29 MED ORDER — AMISULPRIDE (ANTIEMETIC) 5 MG/2ML IV SOLN
INTRAVENOUS | Status: AC
  Filled 2021-07-29: qty 4

## 2021-07-29 MED ORDER — LACTATED RINGERS IR SOLN
Status: DC | PRN
  Administered 2021-07-29: 1000 mL

## 2021-07-29 MED ORDER — BUPIVACAINE LIPOSOME 1.3 % IJ SUSP
INTRAMUSCULAR | Status: AC
  Filled 2021-07-29: qty 20

## 2021-07-29 MED ORDER — LIDOCAINE 2% (20 MG/ML) 5 ML SYRINGE
INTRAMUSCULAR | Status: DC | PRN
  Administered 2021-07-29: 60 mg via INTRAVENOUS
  Administered 2021-07-29: 1.5 mg/kg/h via INTRAVENOUS

## 2021-07-29 MED ORDER — MIDAZOLAM HCL 5 MG/5ML IJ SOLN
INTRAMUSCULAR | Status: DC | PRN
  Administered 2021-07-29: 2 mg via INTRAVENOUS

## 2021-07-29 MED ORDER — ENSURE MAX PROTEIN PO LIQD
2.0000 [oz_av] | ORAL | Status: DC
Start: 2021-07-30 — End: 2021-07-31
  Administered 2021-07-30 – 2021-07-31 (×6): 2 [oz_av] via ORAL

## 2021-07-29 MED ORDER — FENTANYL CITRATE (PF) 100 MCG/2ML IJ SOLN
INTRAMUSCULAR | Status: AC
  Filled 2021-07-29: qty 2

## 2021-07-29 MED ORDER — KCL IN DEXTROSE-NACL 20-5-0.45 MEQ/L-%-% IV SOLN
INTRAVENOUS | Status: DC
  Filled 2021-07-29 (×6): qty 1000

## 2021-07-29 MED ORDER — ACETAMINOPHEN 500 MG PO TABS
1000.0000 mg | ORAL_TABLET | Freq: Once | ORAL | Status: AC
Start: 2021-07-29 — End: 2021-07-29
  Administered 2021-07-29: 1000 mg via ORAL

## 2021-07-29 MED ORDER — HEPARIN SODIUM (PORCINE) 5000 UNIT/ML IJ SOLN
5000.0000 [IU] | INTRAMUSCULAR | Status: AC
  Administered 2021-07-29: 5000 [IU] via SUBCUTANEOUS
  Filled 2021-07-29: qty 1

## 2021-07-29 MED ORDER — ORAL CARE MOUTH RINSE: 15.0000 mL | Freq: Once | OROMUCOSAL | Status: AC

## 2021-07-29 MED ORDER — ENOXAPARIN SODIUM 30 MG/0.3ML IJ SOSY
30.0000 mg | PREFILLED_SYRINGE | Freq: Two times a day (BID) | INTRAMUSCULAR | Status: DC
  Administered 2021-07-30 – 2021-07-31 (×3): 30 mg via SUBCUTANEOUS
  Filled 2021-07-29 (×3): qty 0.3

## 2021-07-29 MED ORDER — ENOXAPARIN SODIUM 40 MG/0.4ML IJ SOSY
40.0000 mg | PREFILLED_SYRINGE | Freq: Two times a day (BID) | INTRAMUSCULAR | 0 refills | Status: AC
  Filled 2021-07-29: qty 12, 15d supply, fill #0
  Filled 2021-08-06: qty 10.4, 13d supply, fill #1

## 2021-07-29 MED ORDER — BUPIVACAINE-EPINEPHRINE (PF) 0.5% -1:200000 IJ SOLN
INTRAMUSCULAR | Status: AC
  Filled 2021-07-29: qty 30

## 2021-07-29 MED ORDER — FENTANYL CITRATE PF 50 MCG/ML IJ SOSY
25.0000 ug | PREFILLED_SYRINGE | INTRAMUSCULAR | Status: DC | PRN
  Administered 2021-07-29: 50 ug via INTRAVENOUS
  Filled 2021-07-29: qty 1

## 2021-07-29 MED ORDER — CHLORHEXIDINE GLUCONATE 4 % EX LIQD: Freq: Once | CUTANEOUS | Status: DC

## 2021-07-29 MED ORDER — SUGAMMADEX SODIUM 500 MG/5ML IV SOLN
INTRAVENOUS | Status: AC
  Filled 2021-07-29: qty 5

## 2021-07-29 MED ORDER — MEPERIDINE HCL 50 MG/ML IJ SOLN: 6.2500 mg | INTRAMUSCULAR | Status: DC | PRN

## 2021-07-29 MED ORDER — ONDANSETRON HCL 4 MG/2ML IJ SOLN: 4.0000 mg | Freq: Once | INTRAMUSCULAR | Status: DC | PRN

## 2021-07-29 MED ORDER — ACETAMINOPHEN 500 MG PO TABS
1000.0000 mg | ORAL_TABLET | Freq: Three times a day (TID) | ORAL | Status: DC
  Administered 2021-07-30 – 2021-07-31 (×2): 1000 mg via ORAL
  Filled 2021-07-29 (×5): qty 2

## 2021-07-29 MED ORDER — ONDANSETRON HCL 4 MG PO TABS
4.0000 mg | ORAL_TABLET | Freq: Three times a day (TID) | ORAL | 0 refills | Status: AC | PRN
  Filled 2021-07-29: qty 20, 7d supply, fill #0

## 2021-07-29 MED ORDER — PANTOPRAZOLE SODIUM 40 MG IV SOLR
40.0000 mg | Freq: Every day | INTRAVENOUS | Status: DC
  Administered 2021-07-29 – 2021-07-30 (×2): 40 mg via INTRAVENOUS
  Filled 2021-07-29 (×2): qty 10

## 2021-07-29 MED ORDER — MIDAZOLAM HCL 2 MG/2ML IJ SOLN
INTRAMUSCULAR | Status: AC
  Filled 2021-07-29: qty 2

## 2021-07-29 MED ORDER — ACETAMINOPHEN 500 MG PO TABS
1000.0000 mg | ORAL_TABLET | ORAL | Status: AC
  Filled 2021-07-29: qty 2

## 2021-07-29 MED ORDER — OXYCODONE HCL 5 MG/5ML PO SOLN
5.0000 mg | Freq: Four times a day (QID) | ORAL | Status: DC | PRN
  Administered 2021-07-30 (×2): 5 mg via ORAL
  Filled 2021-07-29 (×3): qty 5

## 2021-07-29 MED ORDER — DEXAMETHASONE SODIUM PHOSPHATE 4 MG/ML IJ SOLN: 4.0000 mg | INTRAMUSCULAR | Status: DC

## 2021-07-29 MED ORDER — AMISULPRIDE (ANTIEMETIC) 5 MG/2ML IV SOLN
10.0000 mg | Freq: Once | INTRAVENOUS | Status: AC | PRN
  Administered 2021-07-29: 10 mg via INTRAVENOUS

## 2021-07-29 MED ORDER — ROCURONIUM BROMIDE 10 MG/ML (PF) SYRINGE
PREFILLED_SYRINGE | INTRAVENOUS | Status: AC
Start: 2021-07-29 — End: ?
  Filled 2021-07-29: qty 10

## 2021-07-29 MED ORDER — SCOPOLAMINE 1 MG/3DAYS TD PT72
1.0000 | MEDICATED_PATCH | TRANSDERMAL | Status: DC
  Filled 2021-07-29: qty 1

## 2021-07-29 MED ORDER — BUPIVACAINE LIPOSOME 1.3 % IJ SUSP
INTRAMUSCULAR | Status: DC | PRN
  Administered 2021-07-29: 20 mL

## 2021-07-29 MED ORDER — SUGAMMADEX SODIUM 200 MG/2ML IV SOLN
INTRAVENOUS | Status: DC | PRN
  Administered 2021-07-29: 300 mg via INTRAVENOUS

## 2021-07-29 MED ORDER — HYDROMORPHONE HCL 1 MG/ML IJ SOLN
0.2500 mg | INTRAMUSCULAR | Status: DC | PRN
  Administered 2021-07-29: 0.5 mg via INTRAVENOUS

## 2021-07-29 MED ORDER — 0.9 % SODIUM CHLORIDE (POUR BTL) OPTIME
TOPICAL | Status: DC | PRN
  Administered 2021-07-29: 1000 mL

## 2021-07-29 MED ORDER — OXYCODONE HCL 5 MG/5ML PO SOLN: 5.0000 mg | Freq: Once | ORAL | Status: DC | PRN

## 2021-07-29 MED ORDER — PROPOFOL 10 MG/ML IV BOLUS
INTRAVENOUS | Status: AC
  Filled 2021-07-29: qty 20

## 2021-07-29 MED ORDER — CLINDAMYCIN PHOSPHATE 900 MG/50ML IV SOLN
INTRAVENOUS | Status: AC
  Filled 2021-07-29: qty 50

## 2021-07-29 MED ORDER — PROCHLORPERAZINE EDISYLATE 10 MG/2ML IJ SOLN
10.0000 mg | Freq: Four times a day (QID) | INTRAMUSCULAR | Status: DC | PRN
Start: 2021-07-29 — End: 2021-07-31

## 2021-07-29 MED ORDER — ROCURONIUM BROMIDE 10 MG/ML (PF) SYRINGE
PREFILLED_SYRINGE | INTRAVENOUS | Status: DC | PRN
  Administered 2021-07-29: 100 mg via INTRAVENOUS

## 2021-07-29 MED ORDER — ONDANSETRON HCL 4 MG/2ML IJ SOLN
4.0000 mg | Freq: Four times a day (QID) | INTRAMUSCULAR | Status: DC | PRN
  Administered 2021-07-29 – 2021-07-31 (×3): 4 mg via INTRAVENOUS
  Filled 2021-07-29 (×3): qty 2

## 2021-07-29 MED ORDER — HYDROMORPHONE HCL 1 MG/ML IJ SOLN
INTRAMUSCULAR | Status: AC
  Filled 2021-07-29: qty 1

## 2021-07-29 MED ORDER — ACETAMINOPHEN 160 MG/5ML PO SOLN
1000.0000 mg | Freq: Three times a day (TID) | ORAL | Status: DC
  Administered 2021-07-29: 1000 mg via ORAL
  Filled 2021-07-29: qty 40.6

## 2021-07-29 MED ORDER — SCOPOLAMINE 1 MG/3DAYS TD PT72
1.0000 | MEDICATED_PATCH | TRANSDERMAL | Status: DC
  Administered 2021-07-29: 1.5 mg via TRANSDERMAL
  Filled 2021-07-29: qty 1

## 2021-07-29 MED ORDER — ONDANSETRON HCL 4 MG/2ML IJ SOLN
INTRAMUSCULAR | Status: AC
  Filled 2021-07-29: qty 2

## 2021-07-29 MED ORDER — PHENYLEPHRINE HCL (PRESSORS) 10 MG/ML IV SOLN
INTRAVENOUS | Status: AC
  Filled 2021-07-29: qty 1

## 2021-07-29 MED ORDER — FENTANYL CITRATE (PF) 100 MCG/2ML IJ SOLN
INTRAMUSCULAR | Status: DC | PRN
  Administered 2021-07-29 (×2): 25 ug via INTRAVENOUS
  Administered 2021-07-29: 50 ug via INTRAVENOUS
  Administered 2021-07-29: 100 ug via INTRAVENOUS
  Administered 2021-07-29: 50 ug via INTRAVENOUS

## 2021-07-29 MED ORDER — CLINDAMYCIN PHOSPHATE 900 MG/50ML IV SOLN
900.0000 mg | INTRAVENOUS | Status: AC
Start: 2021-07-29 — End: 2021-07-29
  Administered 2021-07-29: 900 mg via INTRAVENOUS

## 2021-07-29 MED ORDER — SODIUM CHLORIDE 0.9 % IV SOLN: 2.0000 g | INTRAVENOUS | Status: DC

## 2021-07-29 MED ORDER — CHLORHEXIDINE GLUCONATE 0.12 % MT SOLN
15.0000 mL | Freq: Once | OROMUCOSAL | Status: AC
  Administered 2021-07-29: 15 mL via OROMUCOSAL

## 2021-07-29 MED ORDER — LIDOCAINE HCL 2 % IJ SOLN
INTRAMUSCULAR | Status: AC
  Filled 2021-07-29: qty 20

## 2021-07-29 MED ORDER — LACTATED RINGERS IV SOLN: INTRAVENOUS | Status: DC

## 2021-07-29 MED ORDER — PROPOFOL 10 MG/ML IV BOLUS
INTRAVENOUS | Status: DC | PRN
  Administered 2021-07-29: 200 mg via INTRAVENOUS

## 2021-07-29 MED ORDER — LIDOCAINE HCL (PF) 2 % IJ SOLN
INTRAMUSCULAR | Status: AC
Start: 2021-07-29 — End: ?
  Filled 2021-07-29: qty 5

## 2021-07-29 MED ORDER — OXYCODONE HCL 5 MG PO TABS
5.0000 mg | ORAL_TABLET | Freq: Four times a day (QID) | ORAL | 0 refills | Status: AC | PRN
  Filled 2021-07-29: qty 15, 4d supply, fill #0

## 2021-07-29 MED ORDER — DEXAMETHASONE SODIUM PHOSPHATE 10 MG/ML IJ SOLN
INTRAMUSCULAR | Status: DC | PRN
  Administered 2021-07-29: 8 mg via INTRAVENOUS

## 2021-07-29 MED ORDER — OXYCODONE HCL 5 MG PO TABS: 5.0000 mg | ORAL_TABLET | Freq: Once | ORAL | Status: DC | PRN

## 2021-07-29 MED ORDER — PHENYLEPHRINE HCL (PRESSORS) 10 MG/ML IV SOLN
INTRAVENOUS | Status: AC
Start: 2021-07-29 — End: ?
  Filled 2021-07-29: qty 1

## 2021-07-29 MED ORDER — BUPIVACAINE-EPINEPHRINE (PF) 0.5% -1:200000 IJ SOLN
INTRAMUSCULAR | Status: DC | PRN
  Administered 2021-07-29: 30 mL

## 2021-07-29 SURGICAL SUPPLY — 82 items
APPLIER CLIP 5 13 M/L LIGAMAX5 (MISCELLANEOUS)
APPLIER CLIP ROT 13.4 12 LRG (CLIP)
BAG COUNTER SPONGE SURGICOUNT (BAG) ×2 IMPLANT
BLADE SURG SZ11 CARB STEEL (BLADE) ×2 IMPLANT
CANNULA REDUC XI 12-8 STAPL (CANNULA) ×1
CANNULA REDUCER 12-8 DVNC XI (CANNULA) ×1 IMPLANT
CHLORAPREP W/TINT 26 (MISCELLANEOUS) ×4 IMPLANT
CLIP APPLIE 5 13 M/L LIGAMAX5 (MISCELLANEOUS) IMPLANT
CLIP APPLIE ROT 13.4 12 LRG (CLIP) IMPLANT
COVER SURGICAL LIGHT HANDLE (MISCELLANEOUS) ×2 IMPLANT
COVER TIP SHEARS 8 DVNC (MISCELLANEOUS) IMPLANT
COVER TIP SHEARS 8MM DA VINCI (MISCELLANEOUS) ×1
DEFOGGER SCOPE WARMER CLEARIFY (MISCELLANEOUS) ×1 IMPLANT
DERMABOND ADVANCED (GAUZE/BANDAGES/DRESSINGS)
DERMABOND ADVANCED .7 DNX12 (GAUZE/BANDAGES/DRESSINGS) IMPLANT
DRAPE ARM DVNC X/XI (DISPOSABLE) ×4 IMPLANT
DRAPE COLUMN DVNC XI (DISPOSABLE) ×1 IMPLANT
DRAPE DA VINCI XI ARM (DISPOSABLE) ×4
DRAPE DA VINCI XI COLUMN (DISPOSABLE) ×1
DRSG TEGADERM 2-3/8X2-3/4 SM (GAUZE/BANDAGES/DRESSINGS) IMPLANT
ELECT REM PT RETURN 15FT ADLT (MISCELLANEOUS) ×2 IMPLANT
ENDOLOOP SUT PDS II  0 18 (SUTURE)
ENDOLOOP SUT PDS II 0 18 (SUTURE) IMPLANT
GAUZE SPONGE 2X2 8PLY STRL LF (GAUZE/BANDAGES/DRESSINGS) IMPLANT
GLOVE BIO SURGEON STRL SZ7.5 (GLOVE) ×4 IMPLANT
GLOVE BIOGEL PI IND STRL 8 (GLOVE) ×2 IMPLANT
GLOVE BIOGEL PI INDICATOR 8 (GLOVE) ×2
GLOVE BIOGEL PI ORTHO PRO 7.5 (GLOVE) ×2
GLOVE PI ORTHO PRO STRL 7.5 (GLOVE) ×2 IMPLANT
GOWN STRL REUS W/ TWL XL LVL3 (GOWN DISPOSABLE) ×2 IMPLANT
GOWN STRL REUS W/TWL XL LVL3 (GOWN DISPOSABLE) ×2
GRASPER SUT TROCAR 14GX15 (MISCELLANEOUS) ×2 IMPLANT
HEMOSTAT SNOW SURGICEL 2X4 (HEMOSTASIS) ×1 IMPLANT
IRRIG SUCT STRYKERFLOW 2 WTIP (MISCELLANEOUS) ×2
IRRIGATION SUCT STRKRFLW 2 WTP (MISCELLANEOUS) ×1 IMPLANT
KIT BASIN OR (CUSTOM PROCEDURE TRAY) ×2 IMPLANT
KIT TURNOVER KIT A (KITS) IMPLANT
MARKER SKIN DUAL TIP RULER LAB (MISCELLANEOUS) ×2 IMPLANT
MAT PREVALON FULL STRYKER (MISCELLANEOUS) ×2 IMPLANT
NDL SPNL 22GX3.5 QUINCKE BK (NEEDLE) ×1 IMPLANT
NEEDLE SPNL 22GX3.5 QUINCKE BK (NEEDLE) ×2 IMPLANT
OBTURATOR OPTICAL STANDARD 8MM (TROCAR)
OBTURATOR OPTICAL STND 8 DVNC (TROCAR)
OBTURATOR OPTICALSTD 8 DVNC (TROCAR) IMPLANT
PACK CARDIOVASCULAR III (CUSTOM PROCEDURE TRAY) ×2 IMPLANT
RELOAD STAPLE 60 2.5 WHT DVNC (STAPLE) IMPLANT
RELOAD STAPLE 60 3.5 BLU DVNC (STAPLE) IMPLANT
RELOAD STAPLE 60 4.3 GRN DVNC (STAPLE) IMPLANT
RELOAD STAPLER 2.5X60 WHT DVNC (STAPLE) ×5 IMPLANT
RELOAD STAPLER 3.5X60 BLU DVNC (STAPLE) ×1 IMPLANT
RELOAD STAPLER 4.3X60 GRN DVNC (STAPLE) IMPLANT
SCISSORS LAP 5X35 DISP (ENDOMECHANICALS) IMPLANT
SEAL CANN UNIV 5-8 DVNC XI (MISCELLANEOUS) ×3 IMPLANT
SEAL XI 5MM-8MM UNIVERSAL (MISCELLANEOUS) ×3
SEALER VESSEL DA VINCI XI (MISCELLANEOUS) ×1
SEALER VESSEL EXT DVNC XI (MISCELLANEOUS) ×1 IMPLANT
SLEEVE GASTRECTOMY 40FR VISIGI (MISCELLANEOUS) ×2 IMPLANT
SOL ANTI FOG 6CC (MISCELLANEOUS) ×1 IMPLANT
SOLUTION ANTI FOG 6CC (MISCELLANEOUS) ×1
SOLUTION ELECTROLUBE (MISCELLANEOUS) ×2 IMPLANT
SPIKE FLUID TRANSFER (MISCELLANEOUS) ×2 IMPLANT
SPONGE GAUZE 2X2 STER 10/PKG (GAUZE/BANDAGES/DRESSINGS)
STAPLER 60 DA VINCI SURE FORM (STAPLE) ×1
STAPLER 60 SUREFORM DVNC (STAPLE) ×1 IMPLANT
STAPLER CANNULA SEAL DVNC XI (STAPLE) ×1 IMPLANT
STAPLER CANNULA SEAL XI (STAPLE) ×1
STAPLER RELOAD 2.5X60 WHITE (STAPLE) ×5
STAPLER RELOAD 2.5X60 WHT DVNC (STAPLE) ×5
STAPLER RELOAD 3.5X60 BLU DVNC (STAPLE) ×1
STAPLER RELOAD 3.5X60 BLUE (STAPLE) ×1
STAPLER RELOAD 4.3X60 GREEN (STAPLE)
STAPLER RELOAD 4.3X60 GRN DVNC (STAPLE)
SUT MNCRL AB 4-0 PS2 18 (SUTURE) ×3 IMPLANT
SUT VICRYL 0 TIES 12 18 (SUTURE) ×2 IMPLANT
SUT VLOC 180 2-0 9IN GS21 (SUTURE) IMPLANT
SYR 20ML LL LF (SYRINGE) ×2 IMPLANT
TOWEL OR 17X26 10 PK STRL BLUE (TOWEL DISPOSABLE) ×2 IMPLANT
TOWEL OR NON WOVEN STRL DISP B (DISPOSABLE) IMPLANT
TRAY FOLEY MTR SLVR 16FR STAT (SET/KITS/TRAYS/PACK) IMPLANT
TROCAR XCEL NON-BLD 5MMX100MML (ENDOMECHANICALS) ×1 IMPLANT
TROCAR Z-THREAD OPTICAL 5X100M (TROCAR) ×1 IMPLANT
TUBING INSUFFLATION 10FT LAP (TUBING) ×2 IMPLANT

## 2021-07-29 NOTE — Progress Notes (Signed)
Patient's first 2 oz of water was started, instructions given patient verbalized understanding. Demonstrated usage of IS.

## 2021-07-29 NOTE — Anesthesia Procedure Notes (Signed)
Procedure Name: Intubation Date/Time: 07/29/2021 2:08 PM  Performed by: Victoriano Lain, CRNAPre-anesthesia Checklist: Patient identified, Emergency Drugs available, Suction available, Patient being monitored and Timeout performed Patient Re-evaluated:Patient Re-evaluated prior to induction Oxygen Delivery Method: Circle system utilized Preoxygenation: Pre-oxygenation with 100% oxygen Induction Type: IV induction Ventilation: Oral airway inserted - appropriate to patient size and Mask ventilation without difficulty Laryngoscope Size: Mac and 4 Grade View: Grade I Tube type: Oral Tube size: 7.5 mm Number of attempts: 1 Airway Equipment and Method: Stylet Placement Confirmation: ETT inserted through vocal cords under direct vision, positive ETCO2 and breath sounds checked- equal and bilateral Secured at: 19 cm Tube secured with: Tape Dental Injury: Teeth and Oropharynx as per pre-operative assessment

## 2021-07-29 NOTE — Interval H&P Note (Signed)
History and Physical Interval Note:  07/29/2021 1:24 PM  Allison Roth  has presented today for surgery, with the diagnosis of morbid obesity.  The various methods of treatment have been discussed with the patient and family. After consideration of risks, benefits and other options for treatment, the patient has consented to  Procedure(s): XI ROBOT ASSISTED GASTRIC SLEEVE RESECTION (N/A) UPPER GI ENDOSCOPY (N/A) as a surgical intervention.  The patient's history has been reviewed, patient examined, no change in status, stable for surgery.  I have reviewed the patient's chart and labs.  Questions were answered to the patient's satisfaction.     [name]

## 2021-07-29 NOTE — Progress Notes (Signed)
Patient seen in Short Stay prior to surgery.  Discussed [facility]I "Goals for Discharge" document with patient including ambulation in halls, Incentive Spirometry use every hour, and oral care.  Also discussed pain and nausea control.  BSTOP education provided including BSTOP information guide, "Guide for Pain Management after your Bariatric Procedure".  Diet progression education provided including "Bariatric Surgery Post-Op Food Plan Phase 1: Liquids".  Questions answered.  Will continue to partner with bedside RN and follow up with patient per protocol after surgery complete.  

## 2021-07-29 NOTE — Anesthesia Postprocedure Evaluation (Signed)
Anesthesia Post Note  Patient: Danecia Guilfoil  Procedure(s) Performed: XI ROBOT ASSISTED GASTRIC SLEEVE RESECTION UPPER GI ENDOSCOPY     Patient location during evaluation: PACU Anesthesia Type: General Level of consciousness: awake and alert, oriented and patient cooperative Pain management: pain level controlled Vital Signs Assessment: post-procedure vital signs reviewed and stable Respiratory status: spontaneous breathing, nonlabored ventilation and respiratory function stable Cardiovascular status: blood pressure returned to baseline and stable Postop Assessment: no apparent nausea or vomiting Anesthetic complications: no   No notable events documented.  Last Vitals:  Vitals:   07/29/21 1730 07/29/21 1813  BP: (!) 169/85 (!) 179/93  Pulse: 89 90  Resp: 20   Temp:  36.9 C  SpO2: 98% 93%    Last Pain:  Vitals:   07/29/21 1813  TempSrc: Oral  PainSc:                  Lannie Fields

## 2021-07-29 NOTE — Op Note (Signed)
07/29/2021 Allison Roth September 03, 1985 [id number]   PRE-OPERATIVE DIAGNOSIS:   Severe obesity (BMI 45)  Chronic midline low back pain with bilateral sciatica  Sleep apnea, unspecified type  Prediabetes  Hypercholesterolemia with hypertriglyceridemia  Elevated transaminase level  POST-OPERATIVE DIAGNOSIS:  same + fatty liver  PROCEDURE:  Procedure(s): Xi robotic SLEEVE GASTRECTOMY  UPPER GI ENDOSCOPY  SURGEON:  Surgeon(s): Atilano Ina, MD FACS FASMBS  ASSISTANTS: Luretha Murphy MD FACS  ANESTHESIA:   general  DRAINS: none   BOUGIE: 40 fr ViSiGi  LOCAL MEDICATIONS USED:   Exparel  EBL: <50 cc  SPECIMEN:  Source of Specimen:  Greater curvature of stomach  DISPOSITION OF SPECIMEN:  PATHOLOGY  COUNTS:  YES  INDICATION FOR PROCEDURE: This is a very pleasant 36 y.o.-year-old severely obese female who has had unsuccessful attempts for sustained weight loss. The patient presents today for a planned laparoscopic/robotic sleeve gastrectomy with upper endoscopy. We have discussed the risk and benefits of the procedure extensively preoperatively. Please see my separate notes.  PROCEDURE: After obtaining informed consent and receiving 5000 units of subcutaneous heparin, the patient was brought to the operating room at [facility] and placed supine on the operating room table. General endotracheal anesthesia was established. Sequential compression devices were placed. A orogastric tube was placed. The patient's abdomen was prepped and draped in the usual standard surgical fashion. The patient received preoperative IV antibiotic. A surgical timeout was performed. ERAS protocol used.   Access to the abdomen was achieved using a 5 mm 0 laparoscope thru a 5 mm trocar In the mid clavicular line in the left midabdomen about 8 cm to the left & at the level of the umbilicus using the Optiview technique. Pneumoperitoneum was smoothly established up to 15 mm of mercury. The  laparoscope was advanced and the abdominal cavity was surveilled. The patient was then placed in reverse Trendelenburg.  A tap block was performed on patient's right side under direct visualization for postoperative pain relief. A 12 mm robotic trocar was placed in the mid right abdomen.   The optical entry trocar in the left midabdomen was changed to a 8 mm robotic trocar under direct visualization. An 31mm robotic trocar was placed in the lateral left abdominal wall also under direct visualization.  The [facility] liver retractor was placed under the left lobe of the liver through a 5 mm trocar incision site in the subxiphoid position. A final 5 mm trocar was placed in the lateral LLQ.  A tap block was performed along the patient's left side also under direct laparoscopic visualization for postoperative pain relief.  Her liver was very fatty.   The XI robot was then docked and targeted for upper abdominal anatomy.  Arm 2  was attached to the left paraumbilical abdominal trocar and the camera was inserted.  Anatomy was targeted.  Arms 1 and ,3,4 were then attached to the robotic trochars.  A fenestrated bipolar was placed in arm 1.  A vessel sealer was placed in arm 3, tip up grasper in arm 4.  My assistant stayed at the bedside while I went to the robotic console.  The stomach was inspected. It was completely decompressed and the orogastric tube was removed.  There was no anterior dimple that was obviously visible. Her preop UGI showed no hiatal hernia.    We identified the pylorus and measured 6 cm proximal to the pylorus and identified an area of where we would start taking down the short gastric vessels.  The  vessel sealer was used to take down the short gastric vessels along the greater curvature of the stomach. We were able to enter the lesser sac. We continued to march along the greater curvature of the stomach taking down the short gastrics.  My assistant provided traction laterally to patient's  right side.  as we approached the gastrosplenic ligament we took care in this area not to injure the spleen. We were able to take down the entire gastrosplenic ligament. We then mobilized the fundus away from the left crus of diaphragm. There were not any significant posterior gastric avascular attachments. This left the stomach completely mobilized. No vessels had been taken down along the lesser curvature of the stomach. There was no posterior plug of fat at the hiatus.    We then reidentified the pylorus. A 40Fr ViSiGi was then placed in the oropharynx and advanced down into the stomach and placed in the distal antrum and positioned along the lesser curvature. It was placed under suction which secured the 40Fr ViSiGi in place along the lesser curve. Then using the robotic 60 mm stapler with a blue load, I placed a stapler along the antrum approximately 6cm from the pylorus. The stapler was angled so that there is ample room at the angularis incisura. I then fired the first staple load after inspecting it posteriorly to ensure adequate space both anteriorly and posteriorly.   The stapler did not have to pause for compression so at this point I started using white loads. The robotic stapler was then repositioned with a 60 mm white load  and we continued to march up along the [place]. My assistant was exchanging the stapler after firing. The tip up grasper was holding traction along the greater curvature stomach along the cauterized short gastric vessels ensuring that the stomach was symmetrically retracted. Prior to each firing of the staple, we rotated the stomach to ensure that there is adequate stomach left.  As we approached the fundus, I continued using 60 mm white cartridge aiming  lateral to the GE junction after mobilizing some of the esophageal fat pad.  The sleeve was inspected. There is no evidence of cork screw. The staple line appeared hemostatic  The CRNA inflated the ViSiGi to the green zone and the  upper abdomen was flooded with saline. There were no bubbles. The sleeve was decompressed and the ViSiGi removed.I performed an upper endoscopy.  The endoscope was placed in the patient's oropharynx and gently glided down.  Z-line at approximately 35 cm.  The endoscope was advanced into the gastric sleeve and insufflated.  I was able to easily advance the endoscope down into the antrum.  Pylorus was visible.  There is no stricture at the incisura.  There was no corkscrewing.  The external staple line was re-inspected and there was no bleeding. There is no evidence of internal bleeding.  The sleeve was decompressed and the endoscope was removed.   The greater curvature the stomach was grasped with a laparoscopic grasper.  At this point I scrubbed back in.  The robot was undocked.  There had been a little bit of bleeding from the undersurface of the left lobe of the liver from the liver retractor.  This was taking care of with robotic scissors with cautery.  Hemostasis is achieved.  I did elect to put a piece of surgical snow in this area since the patient will be getting postoperative Lovenox.  We removed the specimen from the 12 mm trocar site.  The  liver retractor was removed. I then closed the 12 mm trocar site with 1 interrupted 0 Vicryl sutures through the fascia using the endoclose. The closure was viewed laparoscopically and it was airtight. Remaining Exparel was then infiltrated in the preperitoneal spaces around the trocar sites. Pneumoperitoneum was released. All trocar sites were closed with a 4-0 Monocryl in a subcuticular fashion followed by the application of steri-strips, gauze, & tegaderms. The patient was extubated and taken to the recovery room in stable condition. All needle, instrument, and sponge counts were correct x2. There are no immediate complications  (1) 60 mm blue (5) 60 mm white  PLAN OF CARE: Admit to inpatient   PATIENT DISPOSITION:  PACU - hemodynamically stable.   Delay  start of Pharmacological VTE agent (>24hrs) due to surgical blood loss or risk of bleeding:  no   Mary Sella. Andrey Campanile, MD, FACS FASMBS General, Bariatric, & Minimally Invasive Surgery [facility], [place]

## 2021-07-29 NOTE — Progress Notes (Signed)
PHARMACY CONSULT FOR:  Risk Assessment for Post-Discharge VTE Following Bariatric Surgery  Post-Discharge VTE Risk Assessment: This patient's probability of 30-day post-discharge VTE is increased due to the factors marked: x Sleeve gastrectomy  x Liver disorder (transplant, cirrhosis, or nonalcoholic steatohepatitis)   Hx of VTE   Hemorrhage requiring transfusion   GI perforation, leak, or obstruction   ====================================================    Female    Age >/=60 years    BMI >/=50 kg/m2    CHF    Dyspnea at Rest    Paraplegia  x  Non-gastric-band surgery    Operation Time >/=3 hr    Return to OR     Length of Stay >/= 3 d   Hypercoagulable condition   Significant venous stasis      Predicted probability of 30-day post-discharge VTE: see below  Other patient-specific factors to consider: fatty liver seen during surgery - discussed with Dr. Andrey Campanile, will consider as a risk factor for 30-day post-discharge VTE   Recommendation for Discharge: Enoxaparin 40 mg [place] q12h x 30 days post-discharge  Allison Roth is a 36 y.o. female who underwent  robotic sleeve gastrectomy on 07/29/2021   Case start: 1432 Case end: 1602   Allergies  Allergen Reactions   Codeine Anaphylaxis and Hives   Penicillins Anaphylaxis and Swelling   Tylenol With Codeine #3 [Acetaminophen-Codeine] Anaphylaxis and Swelling    Pt says she can take tylenol without codeine and has taken oxycodone without a reaction    Patient Measurements: Height: 5\' 5"  (165.1 cm) Weight: 123.1 kg (271 lb 6.4 oz) IBW/kg (Calculated) : 57 Body mass index is 45.16 kg/m.  No results for input(s): "WBC", "HGB", "HCT", "PLT", "APTT", "CREATININE", "LABCREA", "CREAT24HRUR", "MG", "PHOS", "ALBUMIN", "PROT", "AST", "ALT", "ALKPHOS", "BILITOT", "BILIDIR", "IBILI" in the last 72 hours. Estimated Creatinine Clearance: 128 mL/min (by C-G formula based on SCr of 0.7 mg/dL).    Past Medical History:  Diagnosis  Date   Anemia 02/2021   Sleep apnea    C- Pap     Medications Prior to Admission  Medication Sig Dispense Refill Last Dose   ferrous sulfate 325 (65 FE) MG tablet Take 325 mg by mouth 3 (three) times a week.   Past Week   levonorgestrel (MIRENA) 20 MCG/DAY IUD 1 each by Intrauterine route once.   07/29/2021   NON FORMULARY Pt uses a cpap nightly   07/29/2021       09/29/2021, PharmD, BCPS Pharmacy: [phone] 07/29/2021,4:42 PM

## 2021-07-29 NOTE — Discharge Instructions (Signed)

## 2021-07-29 NOTE — Progress Notes (Deleted)
PHARMACY CONSULT FOR:  Risk Assessment for Post-Discharge VTE Following Bariatric Surgery  Post-Discharge VTE Risk Assessment: This patient's probability of 30-day post-discharge VTE is increased due to the factors marked: x Sleeve gastrectomy   Liver disorder (transplant, cirrhosis, or nonalcoholic steatohepatitis)   Hx of VTE   Hemorrhage requiring transfusion   GI perforation, leak, or obstruction   ====================================================    Female    Age >/=60 years    BMI >/=50 kg/m2    CHF    Dyspnea at Rest    Paraplegia  x  Non-gastric-band surgery    Operation Time >/=3 hr    Return to OR     Length of Stay >/= 3 d   Hypercoagulable condition   Significant venous stasis     Predicted probability of 30-day post-discharge VTE: 0.16%  Other patient-specific factors to consider:   Recommendation for Discharge: No pharmacologic prophylaxis post-discharge   Allison Roth is a 36 y.o. female who underwent  robotic sleeve gastrectomy on 07/29/21   Case start: 1432 Case end: 1602   Allergies  Allergen Reactions   Codeine Anaphylaxis and Hives   Penicillins Anaphylaxis and Swelling   Tylenol With Codeine #3 [Acetaminophen-Codeine] Anaphylaxis and Swelling    Pt says she can take tylenol without codeine and has taken oxycodone without a reaction    Patient Measurements: Height: 5\' 5"  (165.1 cm) Weight: 123.1 kg (271 lb 6.4 oz) IBW/kg (Calculated) : 57 Body mass index is 45.16 kg/m.  No results for input(s): "WBC", "HGB", "HCT", "PLT", "APTT", "CREATININE", "LABCREA", "CREAT24HRUR", "MG", "PHOS", "ALBUMIN", "PROT", "AST", "ALT", "ALKPHOS", "BILITOT", "BILIDIR", "IBILI" in the last 72 hours. Estimated Creatinine Clearance: 128 mL/min (by C-G formula based on SCr of 0.7 mg/dL).    Past Medical History:  Diagnosis Date   Anemia 02/2021   Sleep apnea    C- Pap     Medications Prior to Admission  Medication Sig Dispense Refill Last Dose    ferrous sulfate 325 (65 FE) MG tablet Take 325 mg by mouth 3 (three) times a week.   Past Week   levonorgestrel (MIRENA) 20 MCG/DAY IUD 1 each by Intrauterine route once.   07/29/2021   NON FORMULARY Pt uses a cpap nightly   07/29/2021     09/29/2021, PharmD, BCPS Pharmacy: [phone] 07/29/2021,4:25 PM

## 2021-07-29 NOTE — Transfer of Care (Signed)
Immediate Anesthesia Transfer of Care Note  Patient: Allison Roth  Procedure(s) Performed: XI ROBOT ASSISTED GASTRIC SLEEVE RESECTION UPPER GI ENDOSCOPY  Patient Location: PACU  Anesthesia Type:General  Level of Consciousness: sedated, patient cooperative and responds to stimulation  Airway & Oxygen Therapy: Patient Spontanous Breathing and Patient connected to face mask oxygen  Post-op Assessment: Report given to RN and Post -op Vital signs reviewed and stable  Post vital signs: Reviewed and stable  Last Vitals:  Vitals Value Taken Time  BP 155/83 07/29/21 1620  Temp    Pulse 96 07/29/21 1621  Resp 19 07/29/21 1621  SpO2 100 % 07/29/21 1621  Vitals shown include unvalidated device data.  Last Pain:  Vitals:   07/29/21 1111  TempSrc: Oral         Complications: No notable events documented.

## 2021-07-29 NOTE — H&P (Signed)
PROVIDER: Maevis Mumby Leanne Chang, MD  MRN: [id number] DOB: 1985-11-14 DATE OF ENCOUNTER: 07/18/2021 Subjective   Chief Complaint: No chief complaint on file.   History of Present Illness: Allison Roth is a 36 y.o. 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 I initially met her in February of this year to discuss bariatric surgery. She has completed the bariatric surgery evaluation process..  She has received nutritional and psychological evaluation and clearance.  She did have a period of heavy vaginal bleeding for several weeks and went into the ER and was found to have some anemia. She ended up following up with her GYN and underwent a D&C and placement of a new IUD. She states that she received IV iron infusion at the [place] as well as oral iron supplementation. She states that she had repeat blood work which showed her hemoglobin had normalized. She also had a sleep study which showed mild to moderate sleep apnea. She received a CPAP mask and has been using that and she reports improved sleep  Review of Systems: A complete review of systems was obtained from the patient. I have reviewed this information and discussed as appropriate with the patient. See HPI as well for other ROS.  ROS   Medical History: Past Medical History:  Diagnosis Date  Anxiety  Hyperlipidemia  OSA (obstructive sleep apnea)  Prediabetes   There is no problem list on file for this patient.  Past Surgical History:  Procedure Laterality Date  back surgery  COMBINED REDUCTION MAMMAPLASTY W/ ABDOMINOPLASTY    Allergies  Allergen Reactions  Penicillins Swelling  Tylenol-Codeine Solution Swelling   Current Outpatient Medications on File Prior to Visit  Medication Sig Dispense Refill  multivit-min/ferrous fumarate (MULTI VITAMIN ORAL) Take by mouth   No current  facility-administered medications on file prior to visit.   Family History  Problem Relation Age of Onset  Obesity Mother  Hyperlipidemia (Elevated cholesterol) Father  Obesity Father  Obesity Sister    Social History   Tobacco Use  Smoking Status Never  Smokeless Tobacco Never    Social History   Socioeconomic History  Marital status: Married  Tobacco Use  Smoking status: Never  Smokeless tobacco: Never  Substance and Sexual Activity  Alcohol use: Yes  Alcohol/week: 1.0 standard drink  Types: 1 Glasses of wine per week  Drug use: Never   Objective:   Vitals:  07/18/21 0932  BP: 122/78  Pulse: 79  Temp: 36.7 C (98 F)  SpO2: 98%  Weight: (!) 125 kg (275 lb 9.6 oz)  Height: 165.1 cm ($RemoveB'5\' 5"'JXlIlcla$ )   Body mass index is 45.86 kg/m.  Gen: alert, NAD, non-toxic appearing, severe obesity Pupils: equal, no scleral icterus Pulm: Lungs clear to auscultation, symmetric chest rise CV: regular rate and rhythm No cellulitis. No incisional hernia Ext: no edema,  Skin: no rash, no jaundice  Labs, Imaging and Diagnostic Testing: Total cholesterol in February of this year was 236, triglycerides 101, HDL 52, LDL 164, bilirubin levels normal, ALT 72, AST 50, ALT 76 which is normal on this laboratory set; hemoglobin was 11.3, hematocrit 34.2 platelet count 386, MCV 88; tsh nml  Labs in March revealed a hemoglobin of 9.4, hematocrit 29, comprehensive metabolic panel was normal except for mildly elevated AST of 41, ALT 47, A1c was 5.7, ferritin 51, iron 72, vitamin D 67  upPer GI in March was within normal limits  along with chest x-ray.  sHe presented to [facility] urgent care in early March with complaints of vaginal bleeding x5 weeks. She had an IUD in place. She had a transvaginal ultrasound which revealed a diffusely thickened endometrium, several right ovarian follicles likely physiologic and no intrauterine device visualized she was evaluated at that ER and was instructed to  follow-up with her OB/GYN.  She had a sleep study at the [place] which revealed moderate obstructive sleep apnea and was recommended for CPAP., P AHI was 19.7, supine P AHI was 32.3, nonsupine pAHI was 19.2, central pAHI was 0  Assessment and Plan:  Diagnoses and all orders for this visit:  Severe obesity (CMS-HCC)  Chronic midline low back pain with bilateral sciatica  Sleep apnea, unspecified type  Prediabetes  Hypercholesterolemia with hypertriglyceridemia  Elevated transaminase level    We reviewed her work-up and evaluation. We discussed her upper GI, chest x-ray. We reviewed her labs. We discussed the findings of prediabetes and hypercholesterolemia. I am glad that she is finding that CPAP mask useful. We discussed her anemia and it appears per her report that it has resolved. We rediscussed the typical hospitalization. We rediscussed the typical recovery. She read the surgical consent form and I answered her questions. She has attended her preoperative education class.  This patient encounter took 30 minutes today to perform the following: take history, perform exam, review outside records, interpret imaging, counsel the patient on their diagnosis and document encounter, findings & plan in the EHR  No follow-ups on file.  Leighton Ruff. Redmond Pulling MD FACS General, Minimally Invasive, & Bariatric Surgery Electronically signed by Rudean Curt, MD at 07/18/2021 9:54 AM EDT

## 2021-07-30 ENCOUNTER — Encounter (HOSPITAL_COMMUNITY): Payer: Self-pay | Admitting: General Surgery

## 2021-07-30 LAB — COMPREHENSIVE METABOLIC PANEL
ALT: 142 U/L — ABNORMAL HIGH (ref 0–44)
AST: 121 U/L — ABNORMAL HIGH (ref 15–41)
Albumin: 4 g/dL (ref 3.5–5.0)
Alkaline Phosphatase: 50 U/L (ref 38–126)
Anion gap: 8 (ref 5–15)
BUN: 10 mg/dL (ref 6–20)
CO2: 24 mmol/L (ref 22–32)
Calcium: 8.8 mg/dL — ABNORMAL LOW (ref 8.9–10.3)
Chloride: 107 mmol/L (ref 98–111)
Creatinine, Ser: 0.69 mg/dL (ref 0.44–1.00)
GFR, Estimated: >60 mL/min (ref >60)
Glucose, Bld: 154 mg/dL — ABNORMAL HIGH (ref 70–99)
Potassium: 4.5 mmol/L (ref 3.5–5.1)
Sodium: 139 mmol/L (ref 135–145)
Total Bilirubin: 0.4 mg/dL (ref 0.3–1.2)
Total Protein: 7.9 g/dL (ref 6.5–8.1)

## 2021-07-30 LAB — CBC WITH DIFFERENTIAL/PLATELET
Abs Immature Granulocytes: 0.03 10*3/uL (ref 0.00–0.07)
Basophils Absolute: 0 10*3/uL (ref 0.0–0.1)
Basophils Relative: 0 %
Eosinophils Absolute: 0 10*3/uL (ref 0.0–0.5)
Eosinophils Relative: 0 %
HCT: 39.3 % (ref 36.0–46.0)
Hemoglobin: 12.8 g/dL (ref 12.0–15.0)
Immature Granulocytes: 0 %
Lymphocytes Relative: 8 %
Lymphs Abs: 0.6 10*3/uL — ABNORMAL LOW (ref 0.7–4.0)
MCH: 31.6 pg (ref 26.0–34.0)
MCHC: 32.6 g/dL (ref 30.0–36.0)
MCV: 97 fL (ref 80.0–100.0)
Monocytes Absolute: 0.3 10*3/uL (ref 0.1–1.0)
Monocytes Relative: 4 %
Neutro Abs: 7.1 10*3/uL (ref 1.7–7.7)
Neutrophils Relative %: 88 %
Platelets: 354 10*3/uL (ref 150–400)
RBC: 4.05 MIL/uL (ref 3.87–5.11)
RDW: 14 % (ref 11.5–15.5)
WBC: 8.1 10*3/uL (ref 4.0–10.5)
nRBC: 0 % (ref 0.0–0.2)

## 2021-07-30 NOTE — TOC Benefit Eligibility Note (Signed)
Transition of Care [facility]) Benefit Eligibility Note    Patient Details  Name: Ceriah Kohler MRN: [id number] Date of Birth: April 11, 1985   Medication/Dose: Enoxaparin 40 mg [place] q12h x 30 days     Tier: Other  Prescription Coverage Preferred Pharmacy: local  Spoke with Person/Company/Phone Number:: [place]/ [employer] [phone]  Co-Pay: $0  Prior Approval: No  Deductible: Met       Kerin Salen Phone Number: 07/30/2021, 9:02 AM

## 2021-07-30 NOTE — Progress Notes (Addendum)
Allison Roth [id number] 1985/12/07  CARE TEAM:  PCP: [facility]  Outpatient Care Team: Patient Care Team: [facility] as PCP - General  Inpatient Treatment Team: Treatment Team: Attending Provider: [name], MD; Charge Nurse: Hilliard Clark, RN; Technician: Manon Hilding, NT; Registered Nurse: Ivan Anchors, RN; Utilization Review: Clifton James, RN   Problem List:   Principal Problem:   S/P laparoscopic sleeve gastrectomy   1 Day Post-Op  07/29/2021  PRE-OPERATIVE DIAGNOSIS:   Severe obesity (BMI 45)  Chronic midline low back pain with bilateral sciatica  Sleep apnea, unspecified type  Prediabetes  Hypercholesterolemia with hypertriglyceridemia  Elevated transaminase level   POST-OPERATIVE DIAGNOSIS:  same + fatty liver   PROCEDURE:   Xi robotic SLEEVE GASTRECTOMY  UPPER GI ENDOSCOPY   SURGEON:  Surgeon(s): Atilano Ina, MD FACS FASMBS   ASSISTANTS: Luretha Murphy MD FACS    Assessment  Doing okay but with significant nausea.  [facility] Stay = 1 days)  Plan:  -Improved nausea control. Gradually advance liquids per protocol. Pain control. -VTE prophylaxis- SCDs, etc -mobilize as tolerated to help recovery -Hemoglobin stable.  Okay to continue enoxaparin per postop protocol on bariatric pathway Possible discharge later today if markedly improved and meets oral hydration goals.  Medications available even on fourth July weekend.  Otherwise may need to stay 1 more day or longer.  We will see.  Disposition:  Disposition:  The patient is from: Home  Anticipate discharge to:  Home  Anticipated Date of Discharge is:  July 5,2023    Barriers to discharge:  Pending Clinical improvement (more likely than not)  Patient currently is NOT MEDICALLY STABLE for discharge from the hospital from a surgery standpoint.      I reviewed nursing notes, last 24 h vitals and pain scores, last 48 h intake and output, last  24 h labs and trends, and last 24 h imaging results. I have reviewed this patient's available data, including medical history, events of note, test results, etc as part of my evaluation.  A significant portion of that time was spent in counseling.  Care during the described time interval was provided by me.  This care required moderate level of medical decision making.  07/30/2021    Subjective: (Chief complaint)  Feeling sore & nauseated.  Trying liquids.  No retching.  Has been up walking about.  Objective:  Vital signs:  Vitals:   07/29/21 2122 07/30/21 0128 07/30/21 0150 07/30/21 0625  BP: (!) 153/91 (!) 166/98  (!) 140/94  Pulse: 100 (!) 110 (!) 102 79  Resp: 18 18  18   Temp: 98 F (36.7 C) 98.2 F (36.8 C)  97.7 F (36.5 C)  TempSrc: Oral Oral  Oral  SpO2: 97% 95%  97%  Weight:      Height:        Last BM Date : 07/29/21  Intake/Output   Yesterday:  07/03 0701 - 07/04 0700 In: 3134.9 [P.O.:180; I.V.:2793.9; IV Piggyback:161] Out: 210 [Urine:200; Blood:10] This shift:  No intake/output data recorded.  Bowel function:  Flatus: No  BM:  No  Drain: (No drain)   Physical Exam:  General: Pt awake/alert in no acute distress.  Looks tired and nauseated.  Not toxic. Eyes: PERRL, normal EOM.  Sclera clear.  No icterus Neuro: CN II-XII intact w/o focal sensory/motor deficits. Lymph: No head/neck/groin lymphadenopathy Psych:  No delerium/psychosis/paranoia.  Oriented x 4 HENT: Normocephalic, Mucus membranes moist.  No thrush Neck: Supple, No  tracheal deviation.  No obvious thyromegaly Chest: No pain to chest wall compression.  Good respiratory excursion.  No audible wheezing CV:  Pulses intact.  Regular rhythm.  No major extremity edema MS: Normal AROM mjr joints.  No obvious deformity  Abdomen: Soft.  Nondistended.  Mildly tender at incisions only.  No evidence of peritonitis.  No incarcerated hernias.  Ext:   No deformity.  No mjr edema.  No cyanosis Skin:  No petechiae / purpurea.  No major sores.  Warm and dry    Results:   Cultures: No results found for this or any previous visit (from the past 720 hour(s)).  Labs: Results for orders placed or performed during the hospital encounter of 07/29/21 (from the past 48 hour(s))  ABO/Rh     Status: None   Collection Time: 07/29/21 11:58 AM  Result Value Ref Range   ABO/RH(D)      O POS Performed at [facility], [address], [place], [place] [address]   Hemoglobin and hematocrit, blood     Status: None   Collection Time: 07/29/21  9:20 PM  Result Value Ref Range   Hemoglobin 12.2 12.0 - 15.0 g/dL   HCT 02.4 09.7 - 35.3 %    Comment: Performed at [facility], [address], [place], [place] [address]  CBC with Differential     Status: Abnormal   Collection Time: 07/30/21  4:36 AM  Result Value Ref Range   WBC 8.1 4.0 - 10.5 K/uL   RBC 4.05 3.87 - 5.11 MIL/uL   Hemoglobin 12.8 12.0 - 15.0 g/dL   HCT 26.8 34.1 - 96.2 %   MCV 97.0 80.0 - 100.0 fL   MCH 31.6 26.0 - 34.0 pg   MCHC 32.6 30.0 - 36.0 g/dL   RDW 22.9 79.8 - 92.1 %   Platelets 354 150 - 400 K/uL   nRBC 0.0 0.0 - 0.2 %   Neutrophils Relative % 88 %   Neutro Abs 7.1 1.7 - 7.7 K/uL   Lymphocytes Relative 8 %   Lymphs Abs 0.6 (L) 0.7 - 4.0 K/uL   Monocytes Relative 4 %   Monocytes Absolute 0.3 0.1 - 1.0 K/uL   Eosinophils Relative 0 %   Eosinophils Absolute 0.0 0.0 - 0.5 K/uL   Basophils Relative 0 %   Basophils Absolute 0.0 0.0 - 0.1 K/uL   Immature Granulocytes 0 %   Abs Immature Granulocytes 0.03 0.00 - 0.07 K/uL    Comment: Performed at [facility], [address] [place], [place] [address]  Comprehensive metabolic panel     Status: Abnormal   Collection Time: 07/30/21  4:36 AM  Result Value Ref Range   Sodium 139 135 - 145 mmol/L   Potassium 4.5 3.5 - 5.1 mmol/L   Chloride 107 98 - 111 mmol/L   CO2 24 22 - 32 mmol/L   Glucose, Bld 154 (H) 70 - 99  mg/dL    Comment: Glucose reference range applies only to samples taken after fasting for at least 8 hours.   BUN 10 6 - 20 mg/dL   Creatinine, Ser 4.08 0.44 - 1.00 mg/dL   Calcium 8.8 (L) 8.9 - 10.3 mg/dL   Total Protein 7.9 6.5 - 8.1 g/dL   Albumin 4.0 3.5 - 5.0 g/dL   AST 144 (H) 15 - 41 U/L   ALT 142 (H) 0 - 44 U/L   Alkaline Phosphatase 50 38 - 126 U/L  Total Bilirubin 0.4 0.3 - 1.2 mg/dL   GFR, Estimated >93 >23 mL/min    Comment: (NOTE) Calculated using the CKD-EPI Creatinine Equation (2021)    Anion gap 8 5 - 15    Comment: Performed at [facility], [address] [place], [place] [address]    Imaging / Studies: No results found.  Medications / Allergies: per chart  Antibiotics: Anti-infectives (From admission, onward)    Start     Dose/Rate Route Frequency Ordered Stop   07/29/21 1230  clindamycin (CLEOCIN) IVPB 900 mg       See Hyperspace for full Linked Orders Report.   900 mg 100 mL/hr over 30 Minutes Intravenous On call to O.R. 07/29/21 1222 07/29/21 1439   07/29/21 1230  gentamicin (GARAMYCIN) 440 mg in dextrose 5 % 100 mL IVPB       See Hyperspace for full Linked Orders Report.   440 mg 222 mL/hr over 30 Minutes Intravenous On call to O.R. 07/29/21 1222 07/29/21 1451   07/29/21 1225  clindamycin (CLEOCIN) 900 MG/50ML IVPB       Note to Pharmacy: Montel Clock E: cabinet override      07/29/21 1225 07/29/21 1416   07/29/21 0715  cefoTEtan (CEFOTAN) 2 g in sodium chloride 0.9 % 100 mL IVPB  Status:  Discontinued        2 g 200 mL/hr over 30 Minutes Intravenous On call to O.R. 07/29/21 0700 07/29/21 1217         Note: Portions of this report may have been transcribed using voice recognition software. Every effort was made to ensure accuracy; however, inadvertent computerized transcription errors may be present.   Any transcriptional errors that result from this process are unintentional.    Ardeth Sportsman, MD, FACS,  MASCRS Esophageal, Gastrointestinal & Colorectal Surgery Robotic and Minimally Invasive Surgery  [facility], [facility]  [facility]  [address] [place], [place] [address] [phone] Fax [phone] Main  CONTACT INFORMATION:  Weekday (9AM-5PM): Call [facility] main office at [phone]  Weeknight (5PM-9AM) or Weekend/Holiday: Check [email or web address] (password " [email or web address]") for General Surgery [facility] coverage  (Please, do not use SecureChat as it is not reliable communication to operating surgeons for immediate patient care)      07/30/2021  7:40 AM

## 2021-07-31 ENCOUNTER — Other Ambulatory Visit (HOSPITAL_COMMUNITY): Payer: Self-pay

## 2021-07-31 LAB — CBC WITH DIFFERENTIAL/PLATELET
Abs Immature Granulocytes: 0.01 10*3/uL (ref 0.00–0.07)
Basophils Absolute: 0 10*3/uL (ref 0.0–0.1)
Basophils Relative: 0 %
Eosinophils Absolute: 0.1 10*3/uL (ref 0.0–0.5)
Eosinophils Relative: 1 %
HCT: 38.4 % (ref 36.0–46.0)
Hemoglobin: 12.5 g/dL (ref 12.0–15.0)
Immature Granulocytes: 0 %
Lymphocytes Relative: 30 %
Lymphs Abs: 2.2 10*3/uL (ref 0.7–4.0)
MCH: 31.9 pg (ref 26.0–34.0)
MCHC: 32.6 g/dL (ref 30.0–36.0)
MCV: 98 fL (ref 80.0–100.0)
Monocytes Absolute: 0.4 10*3/uL (ref 0.1–1.0)
Monocytes Relative: 5 %
Neutro Abs: 4.6 10*3/uL (ref 1.7–7.7)
Neutrophils Relative %: 64 %
Platelets: 321 10*3/uL (ref 150–400)
RBC: 3.92 MIL/uL (ref 3.87–5.11)
RDW: 14.2 % (ref 11.5–15.5)
WBC: 7.2 10*3/uL (ref 4.0–10.5)
nRBC: 0 % (ref 0.0–0.2)

## 2021-07-31 MED ORDER — ENOXAPARIN (LOVENOX) PATIENT EDUCATION KIT
PACK | Freq: Once | Status: AC
  Filled 2021-07-31: qty 1

## 2021-07-31 NOTE — Discharge Summary (Signed)
Physician Discharge Summary  Allison Roth LSL:[id number] DOB: [birth year] DOA: 07/29/2021  PCP: [facility]  Admit date: 07/29/2021 Discharge date: 07/31/2021  Recommendations for Outpatient Follow-up:    Follow-up Information     Greer Pickerel, MD. Go on 08/21/2021.   Specialty: General Surgery Why: at 10:15am.  Please arrive 15 minutes prior to your appointment time.  Thank you. Contact information: [address] [place] [address] [phone]         Maczis, Carlena Hurl, [place]. Go on 09/26/2021.   Specialty: General Surgery Why: at 10am for Dr. Redmond Pulling.  Please arrive 15 minutes prior to your appointment time.  Thank you. Contact information: [place] [address] [phone]                Discharge Diagnoses:  Principal Problem:   S/P laparoscopic sleeve gastrectomy Severe obesity (BMI 45)  Chronic midline low back pain with bilateral sciatica  Sleep apnea, unspecified type  Prediabetes  Hypercholesterolemia with hypertriglyceridemia  Elevated transaminase level  Surgical Procedure: Laparoscopic Sleeve Gastrectomy, upper endoscopy  Discharge Condition: Good Disposition: Home  Diet recommendation: Postoperative sleeve gastrectomy diet (liquids only)  Filed Weights   07/29/21 1111 07/29/21 1128  Weight: 123.1 kg 123.1 kg     Hospital Course:  The patient was admitted for a planned laparoscopic sleeve gastrectomy. Please see operative note. Preoperatively the patient was given 5000 units of subcutaneous heparin for DVT prophylaxis. Postoperative prophylactic Lovenox dosing was started on the evening of postoperative day 0. ERAS protocol was used. On the evening of postoperative day 0, the patient was started on water and ice chips. On postoperative day 1 the patient had no fever or tachycardia and was tolerating water in their diet was gradually advanced throughout the day however she didn't meet  criteria for dc due to poor oral intake and nausea.  On POD 2, The patient was ambulating without difficulty. Their vital signs are stable without fever or tachycardia. Their hemoglobin had remained stable. The patient was maintained on their home settings for CPAP therapy. Her oral intake had improved. She had some discomfort at extraction site which we discussed. The patient had received discharge instructions and counseling. They were deemed stable for discharge and had met discharge criteria  She met criteria for extended vte prophylaxis  and was instructed on lovenox.   BP (!) 164/98 (BP Location: Left Arm)   Pulse 76   Temp 98.5 F (36.9 C) (Oral)   Resp 18   Ht $R'5\' 5"'hB$  (1.651 m)   Wt 123.1 kg   LMP 07/28/2021 Comment: IUD  SpO2 99%   BMI 45.16 kg/m   Gen: alert, NAD, non-toxic appearing Pupils: equal, no scleral icterus Pulm: Lungs clear to auscultation, symmetric chest rise CV: regular rate and rhythm Abd: soft, mild approp tender, nondistended. No celluitis No incisional hernia Ext: no edema, no calf tenderness Skin: no rash, no jaundice  Discharge Instructions  Discharge Instructions     Ambulate hourly while awake   Complete by: As directed    Ambulate hourly while awake   Complete by: As directed    Call MD for:  difficulty breathing, headache or visual disturbances   Complete by: As directed    Call MD for:  difficulty breathing, headache or visual disturbances   Complete by: As directed    Call MD for:  persistant dizziness or light-headedness   Complete by: As directed    Call MD for:  persistant dizziness or light-headedness   Complete by: As directed    Call MD for:  persistant nausea and vomiting   Complete by: As directed    Call MD for:  persistant nausea and vomiting   Complete by: As directed    Call MD for:  redness, tenderness, or signs of infection (pain, swelling, redness, odor or green/yellow discharge around incision site)   Complete by: As  directed    Call MD for:  redness, tenderness, or signs of infection (pain, swelling, redness, odor or green/yellow discharge around incision site)   Complete by: As directed    Call MD for:  severe uncontrolled pain   Complete by: As directed    Call MD for:  severe uncontrolled pain   Complete by: As directed    Call MD for:  temperature >101 F   Complete by: As directed    Call MD for:  temperature >101 F   Complete by: As directed    Diet bariatric full liquid   Complete by: As directed    Discharge instructions   Complete by: As directed    See bariatric discharge instructions   Incentive spirometry   Complete by: As directed    Perform hourly while awake      Allergies as of 07/31/2021       Reactions   Codeine Anaphylaxis, Hives   Penicillins Anaphylaxis, Swelling   Tylenol With Codeine #3 [acetaminophen-codeine] Anaphylaxis, Swelling   Pt says she can take tylenol without codeine and has taken oxycodone without a reaction        Medication List     TAKE these medications    enoxaparin 40 MG/0.4ML injection Commonly known as: LOVENOX Inject 0.4 mLs (40 mg total) into the skin 2 (two) times daily for 28 days.   ferrous sulfate 325 (65 FE) MG tablet Take 325 mg by mouth 3 (three) times a week.   levonorgestrel 20 MCG/DAY Iud Commonly known as: MIRENA 1 each by Intrauterine route once.   NON FORMULARY Pt uses a cpap nightly   ondansetron 4 MG tablet Commonly known as: Zofran Take 1 tablet (4 mg total) by mouth every 8 (eight) hours as needed for nausea or vomiting.   oxyCODONE 5 MG immediate release tablet Commonly known as: Roxicodone Take 1 tablet (5 mg total) by mouth every 6 (six) hours as needed for up to 7 days for severe pain.        Follow-up Information     Greer Pickerel, MD. Go on 08/21/2021.   Specialty: General Surgery Why: at 10:15am.  Please arrive 15 minutes prior to your appointment time.  Thank you. Contact information: [address] [place] [address] [phone]         Maczis, Carlena Hurl, [place]. Go on 09/26/2021.   Specialty: General Surgery Why: at 10am for Dr. Redmond Pulling.  Please arrive 15 minutes prior to your appointment time.  Thank you. Contact information: [address] [place] [address] [phone]                  The results of significant diagnostics from this hospitalization (including imaging, microbiology, ancillary and laboratory) are listed below for reference.    Significant Diagnostic Studies: No results found.  Labs: Basic Metabolic Panel: Recent Labs  Lab 07/30/21 0436  NA 139  K 4.5  CL 107  CO2 24  GLUCOSE 154*  BUN 10  CREATININE 0.69  CALCIUM 8.8*  Liver Function Tests: Recent Labs  Lab 07/30/21 0436  AST 121*  ALT 142*  ALKPHOS 50  BILITOT 0.4  PROT 7.9  ALBUMIN 4.0    CBC: Recent Labs  Lab 07/29/21 2120 07/30/21 0436 07/31/21 0436  WBC  --  8.1 7.2  NEUTROABS  --  7.1 4.6  HGB 12.2 12.8 12.5  HCT 37.1 39.3 38.4  MCV  --  97.0 98.0  PLT  --  354 321    CBG: No results for input(s): "GLUCAP" in the last 168 hours.  Principal Problem:   S/P laparoscopic sleeve gastrectomy   Time coordinating discharge: 25 min  Signed:  Gayland Curry, MD [facility], [place] [phone] 07/31/2021, 1:16 PM

## 2021-07-31 NOTE — Progress Notes (Signed)
Pt had nausea episode tonight. PRN zofran given. Pt refused to take the rest of the protein shake. Educated. Simethicone given for pt's complains of flatulence and bloating.  Pt walked one time this shift. Advised pt to walk more today.

## 2021-07-31 NOTE — Progress Notes (Signed)
Patient was given discharge instructions, and all questions were answered.  Patient was stable for discharge and was taken to the main exit by wheelchair. 

## 2021-07-31 NOTE — Progress Notes (Signed)
Patient alert and oriented, Post op day 2.  Provided support and encouragement.  Encouraged pulmonary toilet, ambulation and small sips of liquids.  All questions answered.   Ordered Lovenox teaching kit and pt to review Lovenox "Patient-Self Injection Video".   Will continue to monitor and follow up as needed.

## 2021-08-01 LAB — SURGICAL PATHOLOGY

## 2021-08-02 ENCOUNTER — Telehealth (HOSPITAL_COMMUNITY): Payer: Self-pay | Admitting: *Deleted

## 2021-08-02 NOTE — Telephone Encounter (Signed)
1.  Tell me about your pain and pain management? Pt c/o right lower abdominal incisional pain with movement and exertion.  Pt states that she hasn't been taking much pain medication (oxycodone) because it makes her sleepy. Discussed with patient to try and splint her abdomen with changing positions to assist with the discomfort.  Also discussed with patient to take Tylenol to assist with the discomfort. Encouraged pt to try options and/or contact [facility] if still concerned.   2.  Let's talk about fluid intake.  How much total fluid are you taking in? Pt states that she is getting in at least 64oz of fluid including protein shakes, bottled water, and broth. Pt instructed to assess status and suggestions daily utilizing Hydration Action Plan on discharge folder and to call [facility] if in the "red zone".   3.  How much protein have you taken in the last 2 days? Pt states that she is working to meet goal of goal of 60g of protein today.  Discussed with patient to get in two shakes daily to meet goal. Pt plans to drink remainder of protein throughout the rest of the day to meet goal.  4.  Have you had nausea?  Tell me about when have experienced nausea and what you did to help? Pt denies nausea.   5.  Has the frequency or color changed with your urine? Pt states that she is urinating "fine" with no changes in frequency or urgency.     6.  Tell me what your incisions look like? "Incisions look fine". Pt denies a fever, chills.  Pt states incisions are not swollen, open, or draining.  Pt encouraged to call [facility] if incisions change.   7.  Have you been passing gas? BM? Pt states that she is having BMs. Last BM 08/01/21.     8.  If a problem or question were to arise who would you call?  Do you know contact numbers for [facility], [facility], and [facility]? Pt denies dehydration symptoms.  Pt can describe s/sx of dehydration.  Pt knows to call [facility] for surgical, [facility] for nutrition, and [facility] for non-urgent questions or concerns.   9.   How has the walking going? Pt states she is walking around and able to be active without difficulty.   10. Are you still using your incentive spirometer?  If so, how often? Pt states that she is doing the I.S. every hour at least 10 times.  Pt encouraged to continue to use incentive spirometer, at least 10x every hour while awake until s/he sees the surgeon.  11.  How are your vitamins and calcium going?  How are you taking them? Pt states that she is taking her supplements and vitamins without difficulty. Pt states that she will begin the supplements today.  Reinforced education about taking supplements at least two hours apart.  LOVENOX: Pt states that she is taking the Lovenox injections without difficulty.  Reinforced education about taking injections q12h and rotating injection sites.  Pt also instructed to monitor for unusual bruising and/or signs of bleeding.  Reminded patient that the first 30 days post-operatively are important for successful recovery.  Practice good hand hygiene, wearing a mask when appropriate (since optional in most places), and minimizing exposure to people who live outside of the home, especially if they are exhibiting any respiratory, GI, or illness-like symptoms.

## 2021-08-06 ENCOUNTER — Other Ambulatory Visit (HOSPITAL_COMMUNITY): Payer: Self-pay

## 2021-08-10 ENCOUNTER — Other Ambulatory Visit (HOSPITAL_COMMUNITY): Payer: Self-pay

## 2021-08-13 ENCOUNTER — Encounter: Payer: Self-pay | Admitting: Dietician

## 2021-08-13 ENCOUNTER — Encounter: Payer: 59 | Attending: General Surgery | Admitting: Dietician

## 2021-08-13 DIAGNOSIS — E669 Obesity, unspecified: Secondary | ICD-10-CM

## 2021-08-13 DIAGNOSIS — Z6841 Body Mass Index (BMI) 40.0 and over, adult: Secondary | ICD-10-CM | POA: Diagnosis not present

## 2021-08-13 DIAGNOSIS — Z713 Dietary counseling and surveillance: Secondary | ICD-10-CM | POA: Insufficient documentation

## 2021-08-13 NOTE — Progress Notes (Signed)
2 Week Post-Operative Nutrition Class   Patient was seen on 08/13/2021 for Post-Operative Nutrition education at the [facility].    Surgery date: 07/29/2021 Surgery type: sleeve  Anthropometrics  Start weight at [facility]: 266.3 lbs (date: 04/10/2021)  Height: 65 in Weight:  258.5 pounds BMI: 43.02 kg/m2     Clinical  Medical hx: N/A Medications: multivitamin, iron 320mg , progesterone  Labs: RBC 3.13, HGB 9.4, HCT 28.9, A1C 5.7 Notable signs/symptoms: none reported Any previous deficiencies? YES, anemia   Body Composition Scale 08/13/2021  Current Body Weight 258.5  Total Body Fat % 44.7  Visceral Fat 14  Fat-Free Mass % 55.2   Total Body Water % 42.1  Muscle-Mass lbs 33.6  BMI 42.7  Body Fat Displacement          Torso  lbs 71.6         Left Leg  lbs 14.3         Right Leg  lbs 14.3         Left Arm  lbs 7.1         Right Arm   lbs 7.1      The following the learning objectives were met by the patient during this course: Identifies Phase 3 (Soft, High Proteins) Dietary Goals and will begin from 2 weeks post-operatively to 2 months post-operatively Identifies appropriate sources of fluids and proteins  Identifies appropriate fat sources and healthy verses unhealthy fat types   States protein recommendations and appropriate sources post-operatively Identifies the need for appropriate texture modifications, mastication, and bite sizes when consuming solids Identifies appropriate fat consumption and sources Identifies appropriate multivitamin and calcium sources post-operatively Describes the need for physical activity post-operatively and will follow MD recommendations States when to call healthcare provider regarding medication questions or post-operative complications   Handouts given during class include: Phase 3A: Soft, High Protein Diet Handout Phase 3 High Protein Meals Healthy Fats   Follow-Up Plan: Patient will follow-up at [facility] in  6 weeks for 2 month post-op nutrition visit for diet advancement per MD.

## 2021-08-19 ENCOUNTER — Telehealth: Payer: Self-pay | Admitting: Dietician

## 2021-08-19 NOTE — Telephone Encounter (Signed)
RD called pt to verify fluid intake once starting soft, solid proteins 2 week post-bariatric surgery.   Daily Fluid intake: 64 oz Daily Protein intake: 60 grams Bowel Habits: normal  Concerns/issues:  pt states she is also eating eggs, plant based patties, chili and beans. Pt states she is nervous about trying beef and chicken.

## 2021-09-24 ENCOUNTER — Encounter: Payer: Self-pay | Admitting: Skilled Nursing Facility1

## 2021-09-24 ENCOUNTER — Encounter: Payer: 59 | Attending: General Surgery | Admitting: Skilled Nursing Facility1

## 2021-09-24 DIAGNOSIS — E669 Obesity, unspecified: Secondary | ICD-10-CM | POA: Insufficient documentation

## 2021-09-24 NOTE — Progress Notes (Signed)
Bariatric Nutrition Follow-Up Visit Medical Nutrition Therapy   NUTRITION ASSESSMENT   Surgery date: 07/29/2021 Surgery type: sleeve  Anthropometrics  Start weight at [facility]: 266.3 lbs (date: 04/10/2021)  Weight: 247.1 pounds   Clinical  Medical hx: N/A Medications: multivitamin, iron 320mg , progesterone  Labs: RBC 3.13, HGB 9.4, HCT 28.9, A1C 5.7 Notable signs/symptoms: none reported Any previous deficiencies? YES, anemia   Body Composition Scale 08/13/2021 09/24/2021  Current Body Weight 258.5 247.1  Total Body Fat % 44.7 43.5  Visceral Fat 14 13  Fat-Free Mass % 55.2 56.4   Total Body Water % 42.1 42.7  Muscle-Mass lbs 33.6 33.5  BMI 42.7 40.8  Body Fat Displacement           Torso  lbs 71.6 66.7         Left Leg  lbs 14.3 13.3         Right Leg  lbs 14.3 13.3         Left Arm  lbs 7.1 6.6         Right Arm   lbs 7.1 6.6   Lifestyle & Dietary Hx  Pt states she cannot tolerate most meats.   Pt states she is still working on slowing down her pace with eating.  Pt state she notices clearer skin sense adding in more water.  Pt state she has great energy and feels she has more focus and sleeps better in the night.    Estimated daily fluid intake: 64 oz Estimated daily protein intake: 70 g Supplements: multivitamin and calcium Current average weekly physical activity: trail walking, [employer] 09/26/2021   24-Hr Dietary Recall First Meal: eggs Snack:   Second Meal: plant based protein or protein shake or beans Snack:   Third Meal: fish or vegan chili Snack:  Beverages: water  Post-Op Goals/ Signs/ Symptoms Using straws: no Drinking while eating: no Chewing/swallowing difficulties: no Changes in vision: no Changes to mood/headaches: no Hair loss/changes to skin/nails: no Difficulty focusing/concentrating: no Sweating: no Limb weakness: no Dizziness/lightheadedness: no Palpitations: no  Carbonated/caffeinated beverages: no N/V/D/C/Gas: no Abdominal  pain: no Dumping syndrome: no    NUTRITION DIAGNOSIS  Overweight/obesity ([place]-3.3) related to past poor dietary habits and physical inactivity as evidenced by completed bariatric surgery and following dietary guidelines for continued weight loss and healthy nutrition status.     NUTRITION INTERVENTION Nutrition counseling (C-1) and education (E-2) to facilitate bariatric surgery goals, including: Diet advancement to the next phase (phase 4) now including non starchy vegetables The importance of consuming adequate calories as well as certain nutrients daily due to the body's need for essential vitamins, minerals, and fats The importance of daily physical activity and to reach a goal of at least 150 minutes of moderate to vigorous physical activity weekly (or as directed by their physician) due to benefits such as increased musculature and improved lab values The importance of intuitive eating specifically learning hunger-satiety cues and understanding the importance of learning a new body: The importance of mindful eating to avoid grazing behaviors   Goals: -Continue to aim for a minimum of 64 fluid ounces 7 days a week with at least 30 ounces being plain water  -Eat non-starchy vegetables 2 times a day 7 days a week  -Start out with soft cooked vegetables today and tomorrow; if tolerated begin to eat raw vegetables or cooked including salads  -Eat your 3 ounces of protein first then start in on your non-starchy vegetables; once you understand how much of  your meal leads to satisfaction and not full while still eating 3 ounces of protein and non-starchy vegetables you can eat them in any order   -Continue to aim for 30 minutes of activity at least 5 times a week  -Do NOT cook with/add to your food: alfredo sauce, cheese sauce, barbeque sauce, ketchup, fat back, butter, bacon grease, grease, Crisco, OR SUGAR   Handouts Provided Include  Phase 4  Learning Style & Readiness for  Change Teaching method utilized: Visual & Auditory  Demonstrated degree of understanding via: Teach Back  Readiness Level: action Barriers to learning/adherence to lifestyle change: none stated  RD's Notes for Next Visit Assess adherence to pt chosen goals     MONITORING & EVALUATION Dietary intake, weekly physical activity, body weight  Next Steps Patient is to follow-up in 3 months

## 2021-12-10 ENCOUNTER — Ambulatory Visit: Payer: 59

## 2022-02-04 ENCOUNTER — Ambulatory Visit: Payer: Self-pay

## 2022-03-11 ENCOUNTER — Ambulatory Visit: Payer: Self-pay | Admitting: Skilled Nursing Facility1

## 2023-03-06 ENCOUNTER — Encounter (HOSPITAL_COMMUNITY): Payer: Self-pay | Admitting: *Deleted

## 2023-03-23 IMAGING — RF DG UGI W SINGLE CM
11 of 15 series · 15 of 24 positions shown · non-contrast
Comparison: None

CLINICAL DATA: Patient with history of obesity and currently
undergoing evaluation for laparoscopic sleeve gastrectomy

EXAM:
DG UGI W SINGLE CM
TECHNIQUE: Single contrast examination was then performed using thin liquid
barium. This exam was performed by Siew Lee, Hj Matasan, and was
supervised and interpreted by Dr. Ayumi Ye.
FLUOROSCOPY TIME:  Radiation Exposure Index (as provided by the
fluoroscopic device): 8.4 mGy

[Series 4: t abdomen · 1 of 1 slices shown]
[im 1/1]
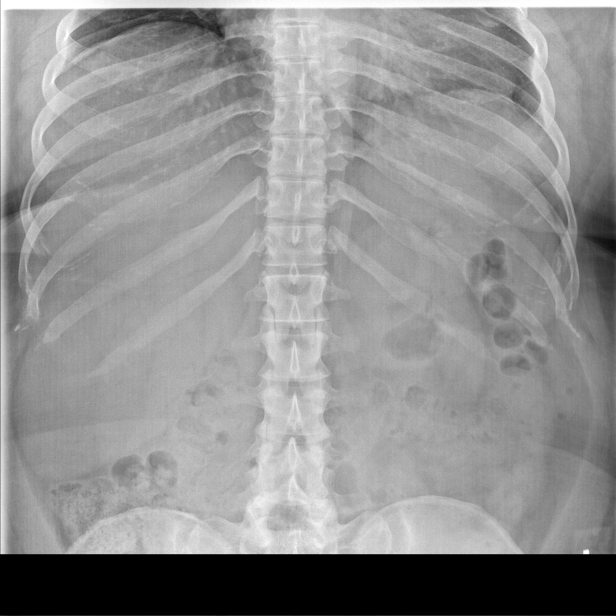

[Series 6: cp_standard · 1 of 163 frames shown (1 of 10)]
[frame 82/163]
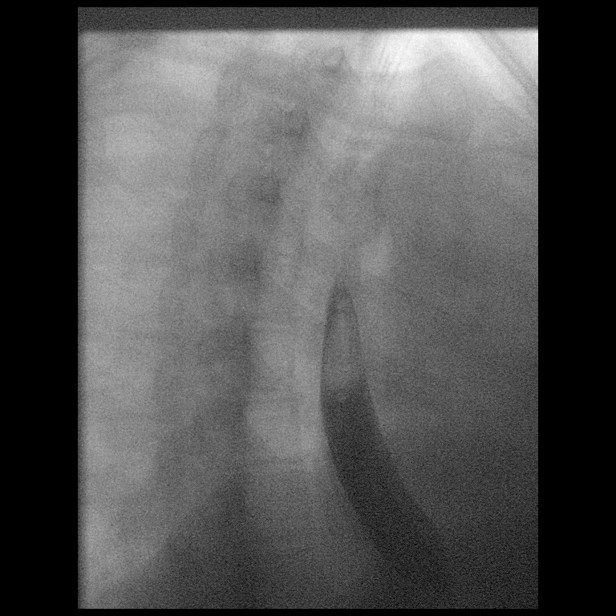

[Series 7: cp_standard · 2 of 178 frames shown (2 of 10)]
[frame 27/178]
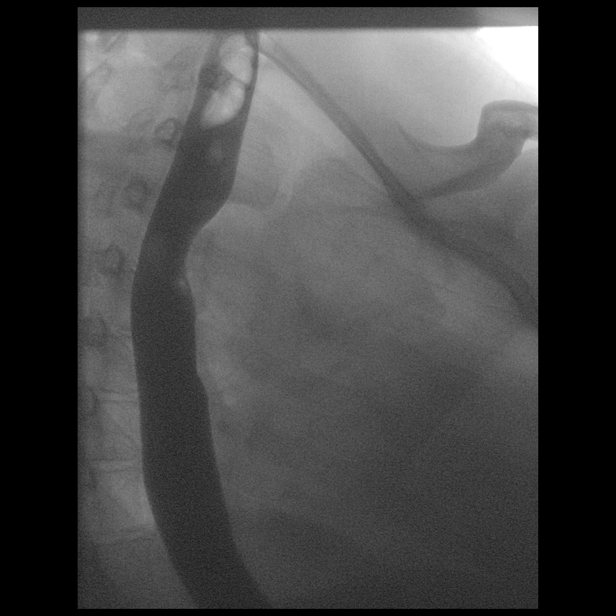
[frame 39/178]
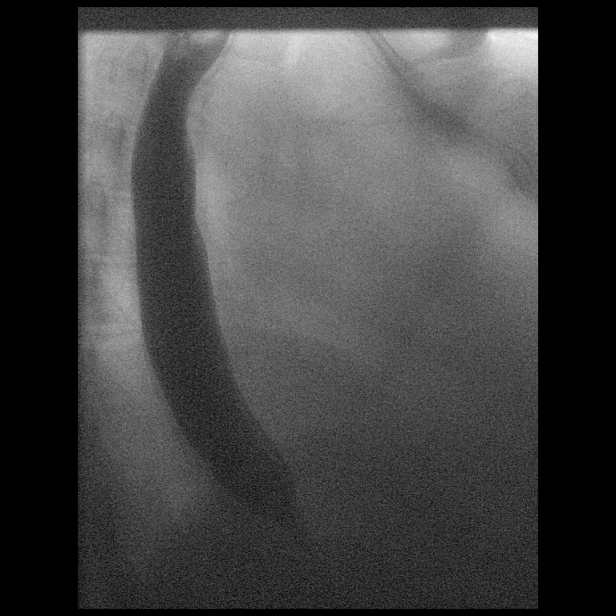

[Series 8: cp_standard · 2 of 94 frames shown (3 of 10)]
[frame 1/94]
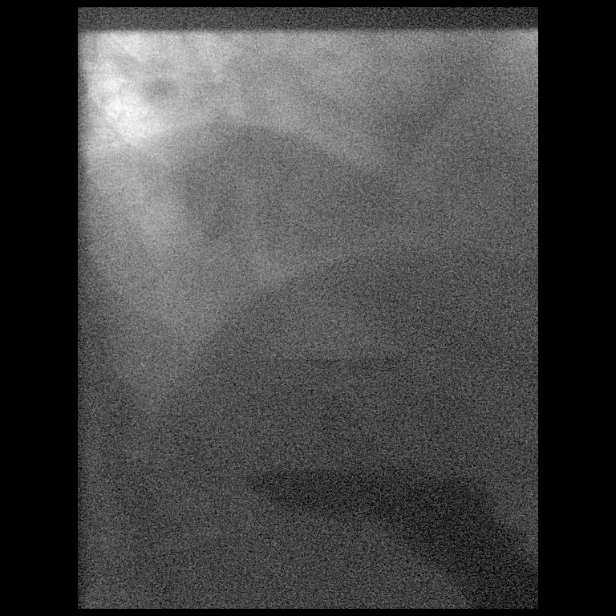
[frame 15/94]
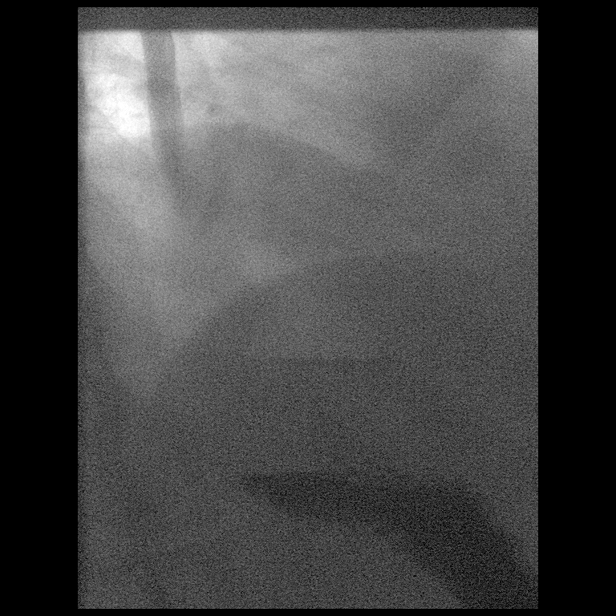

[Series 9: cp_standard · 1 of 1 slices shown (4 of 10)]
[im 1/1]
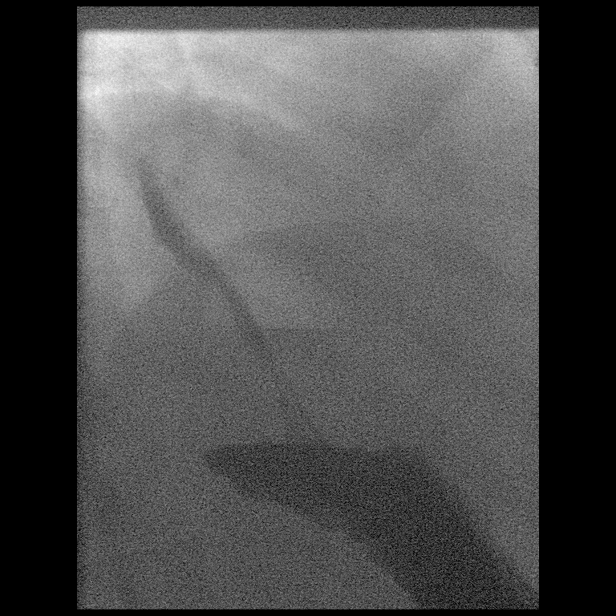

[Series 11: cp_standard · 2 of 232 frames shown (5 of 10)]
[frame 117/232]
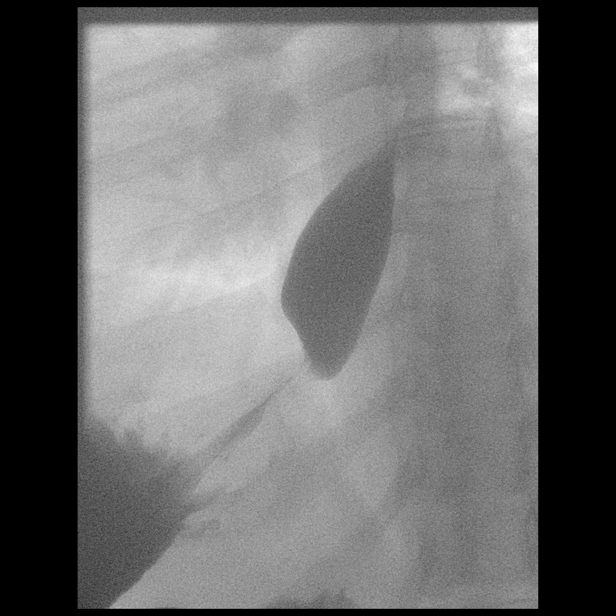
[frame 172/232]
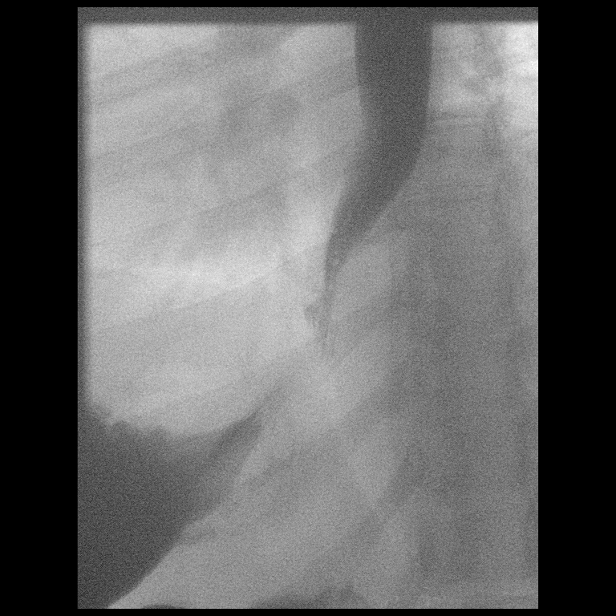

[Series 13: cp_standard · 1 of 1 slices shown (6 of 10)]
[im 1/1]
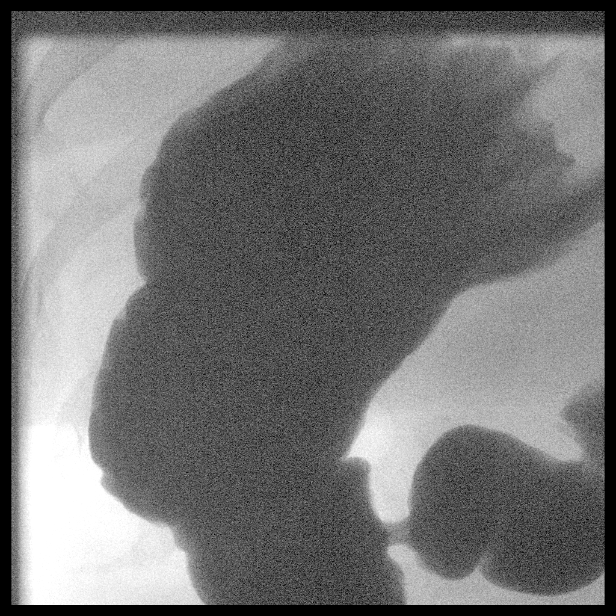

[Series 14: cp_standard · 1 of 1 slices shown (7 of 10)]
[im 1/1]
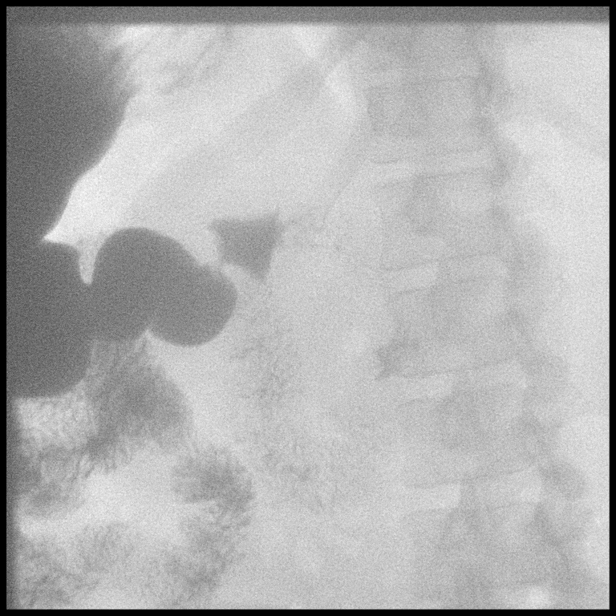

[Series 17: cp_standard · 1 of 1 slices shown (8 of 10)]
[im 1/1]
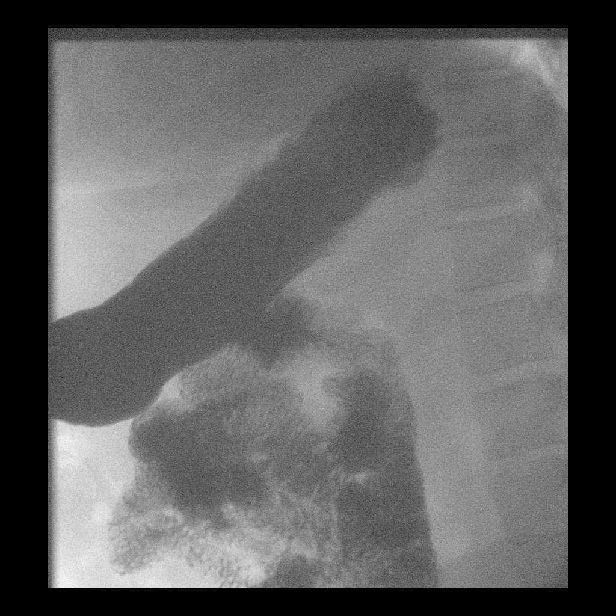

[Series 20: cp_standard · 1 of 1 slices shown (9 of 10)]
[im 1/1]
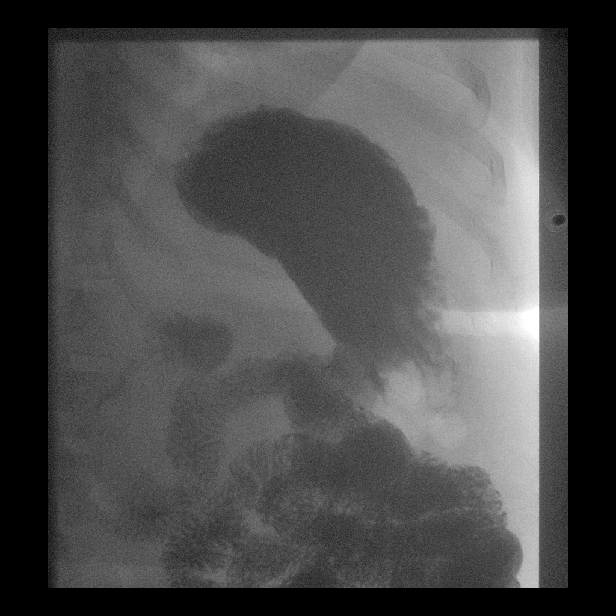

[Series 21: cp_standard · 2 of 235 frames shown (10 of 10)]
[frame 36/235]
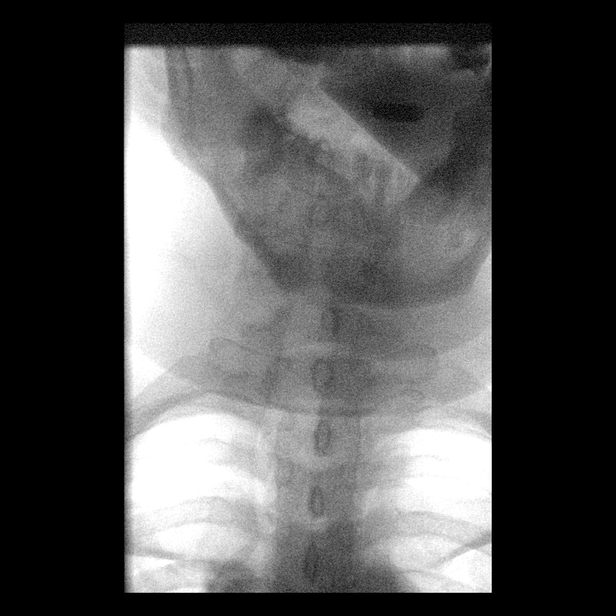
[frame 200/235]
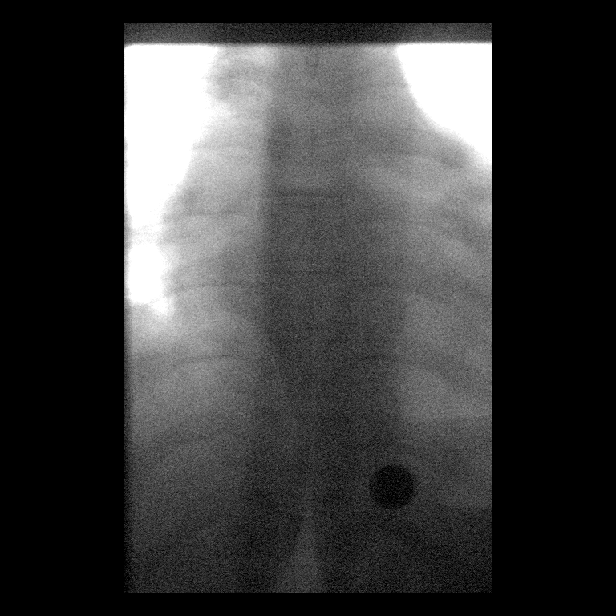

[15 of 24 positions shown; findings below may reference images not displayed]

FINDINGS: Esophagus:  Normal appearance.

Esophageal motility:  Within normal limits.

Gastroesophageal reflux:  None visualized.

Ingested 13mm barium tablet: Passed normally

Stomach: Normal appearance. No hiatal hernia.

Gastric emptying: Normal.

Duodenum:  Normal appearance.

Other:  None.
IMPRESSION: Unremarkable single contrast upper GI

## 2024-02-26 ENCOUNTER — Encounter (HOSPITAL_COMMUNITY): Payer: Self-pay | Admitting: *Deleted
